# Patient Record
Sex: Female | Born: 1975 | Race: White | Hispanic: No | Marital: Married | Smoking: Never smoker
Health system: Southern US, Community
[De-identification: ages and names within clinical notes are randomized; demographics above are authoritative.]

## PROBLEM LIST (undated history)

## (undated) DIAGNOSIS — T8859XA Other complications of anesthesia, initial encounter: Secondary | ICD-10-CM

## (undated) DIAGNOSIS — Z8619 Personal history of other infectious and parasitic diseases: Secondary | ICD-10-CM

## (undated) DIAGNOSIS — IMO0002 Reserved for concepts with insufficient information to code with codable children: Secondary | ICD-10-CM

## (undated) DIAGNOSIS — T4145XA Adverse effect of unspecified anesthetic, initial encounter: Secondary | ICD-10-CM

## (undated) DIAGNOSIS — O139 Gestational [pregnancy-induced] hypertension without significant proteinuria, unspecified trimester: Secondary | ICD-10-CM

## (undated) DIAGNOSIS — N870 Mild cervical dysplasia: Secondary | ICD-10-CM

## (undated) DIAGNOSIS — R87629 Unspecified abnormal cytological findings in specimens from vagina: Secondary | ICD-10-CM

## (undated) HISTORY — DX: Gestational (pregnancy-induced) hypertension without significant proteinuria, unspecified trimester: O13.9

## (undated) HISTORY — DX: Unspecified abnormal cytological findings in specimens from vagina: R87.629

## (undated) HISTORY — DX: Reserved for concepts with insufficient information to code with codable children: IMO0002

## (undated) HISTORY — PX: TONSILLECTOMY AND ADENOIDECTOMY: SUR1326

## (undated) HISTORY — DX: Mild cervical dysplasia: N87.0

## (undated) HISTORY — DX: Adverse effect of unspecified anesthetic, initial encounter: T41.45XA

## (undated) HISTORY — DX: Personal history of other infectious and parasitic diseases: Z86.19

## (undated) HISTORY — DX: Other complications of anesthesia, initial encounter: T88.59XA

---

## 1996-04-18 HISTORY — PX: CARPAL TUNNEL RELEASE: SHX101

## 1997-07-21 ENCOUNTER — Ambulatory Visit (HOSPITAL_BASED_OUTPATIENT_CLINIC_OR_DEPARTMENT_OTHER): Admission: RE | Admit: 1997-07-21 | Discharge: 1997-07-21 | Payer: Self-pay | Admitting: Orthopedic Surgery

## 1999-07-06 ENCOUNTER — Other Ambulatory Visit: Admission: RE | Admit: 1999-07-06 | Discharge: 1999-07-06 | Payer: Self-pay | Admitting: Internal Medicine

## 2002-09-25 ENCOUNTER — Other Ambulatory Visit: Admission: RE | Admit: 2002-09-25 | Discharge: 2002-09-25 | Payer: Self-pay | Admitting: Obstetrics & Gynecology

## 2003-03-14 ENCOUNTER — Inpatient Hospital Stay (HOSPITAL_COMMUNITY): Admission: AD | Admit: 2003-03-14 | Discharge: 2003-03-14 | Payer: Self-pay | Admitting: Obstetrics & Gynecology

## 2003-03-18 ENCOUNTER — Inpatient Hospital Stay (HOSPITAL_COMMUNITY): Admission: AD | Admit: 2003-03-18 | Discharge: 2003-03-18 | Payer: Self-pay | Admitting: Obstetrics & Gynecology

## 2003-03-21 ENCOUNTER — Inpatient Hospital Stay (HOSPITAL_COMMUNITY): Admission: RE | Admit: 2003-03-21 | Discharge: 2003-03-24 | Payer: Self-pay | Admitting: Obstetrics & Gynecology

## 2003-03-21 ENCOUNTER — Encounter (INDEPENDENT_AMBULATORY_CARE_PROVIDER_SITE_OTHER): Payer: Self-pay | Admitting: *Deleted

## 2003-03-25 ENCOUNTER — Encounter: Admission: RE | Admit: 2003-03-25 | Discharge: 2003-04-24 | Payer: Self-pay | Admitting: Obstetrics & Gynecology

## 2003-04-25 ENCOUNTER — Encounter: Admission: RE | Admit: 2003-04-25 | Discharge: 2003-05-25 | Payer: Self-pay | Admitting: Obstetrics & Gynecology

## 2003-04-28 ENCOUNTER — Other Ambulatory Visit: Admission: RE | Admit: 2003-04-28 | Discharge: 2003-04-28 | Payer: Self-pay | Admitting: Obstetrics & Gynecology

## 2003-05-26 ENCOUNTER — Encounter: Admission: RE | Admit: 2003-05-26 | Discharge: 2003-06-25 | Payer: Self-pay | Admitting: Obstetrics & Gynecology

## 2003-07-24 ENCOUNTER — Encounter: Admission: RE | Admit: 2003-07-24 | Discharge: 2003-08-23 | Payer: Self-pay | Admitting: Obstetrics & Gynecology

## 2003-09-23 ENCOUNTER — Encounter: Admission: RE | Admit: 2003-09-23 | Discharge: 2003-10-23 | Payer: Self-pay | Admitting: Obstetrics & Gynecology

## 2003-11-23 ENCOUNTER — Encounter: Admission: RE | Admit: 2003-11-23 | Discharge: 2003-12-23 | Payer: Self-pay | Admitting: Obstetrics & Gynecology

## 2003-12-24 ENCOUNTER — Encounter: Admission: RE | Admit: 2003-12-24 | Discharge: 2004-01-23 | Payer: Self-pay | Admitting: Obstetrics & Gynecology

## 2004-02-23 ENCOUNTER — Encounter: Admission: RE | Admit: 2004-02-23 | Discharge: 2004-03-24 | Payer: Self-pay | Admitting: Obstetrics & Gynecology

## 2004-03-10 IMAGING — US US FETAL BPP W/O NONSTRESS
1 series · 18 of 23 positions shown · non-contrast
Comparison: none

CLINICAL DATA: 27-year-old.  G1 P0 with increased blood pressure.

[Series 1: us fetal bpp w/o nonstress · non-contrast · 18 of 23 slices shown]
[im 1/23]
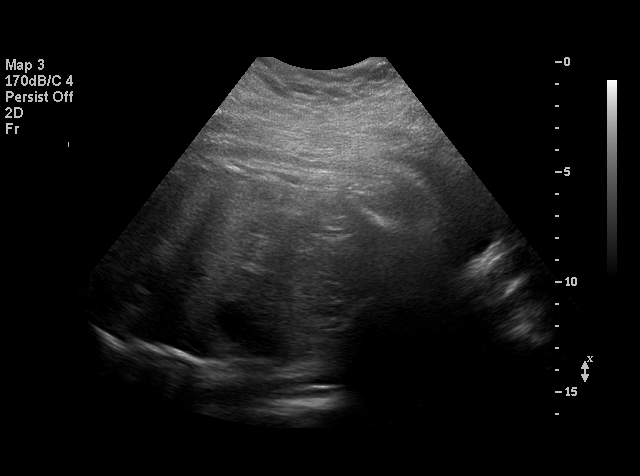
[im 2/23]
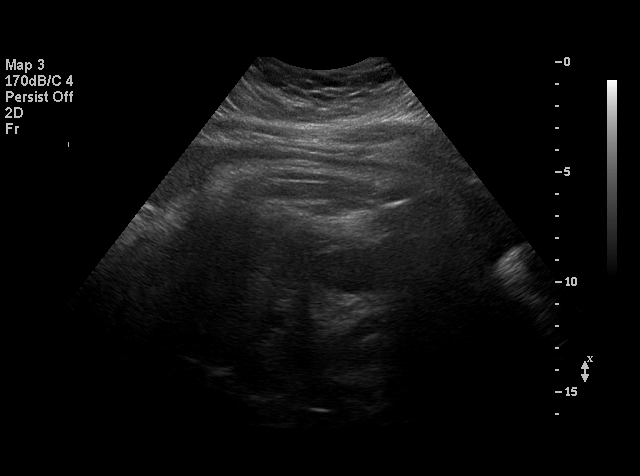
[im 4/23]
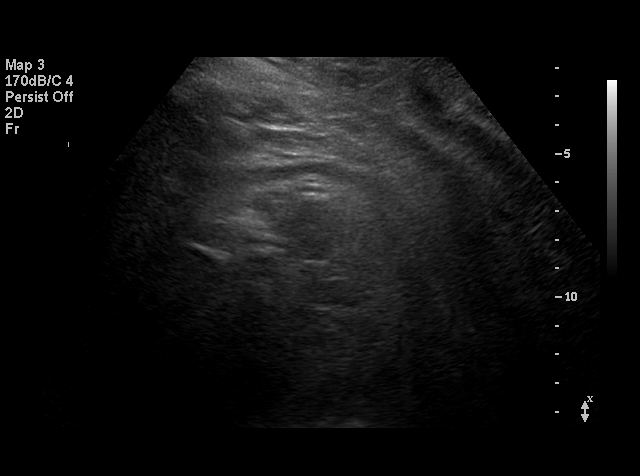
[im 5/23]
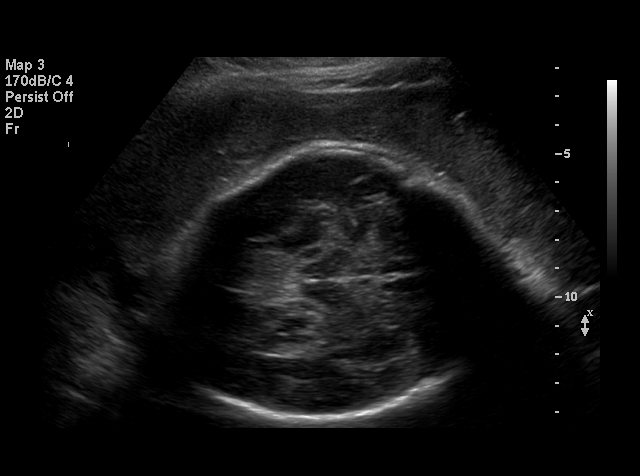
[im 6/23]
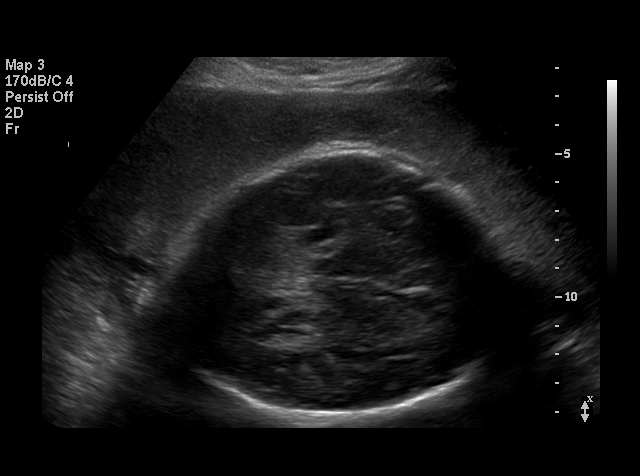
[im 8/23]
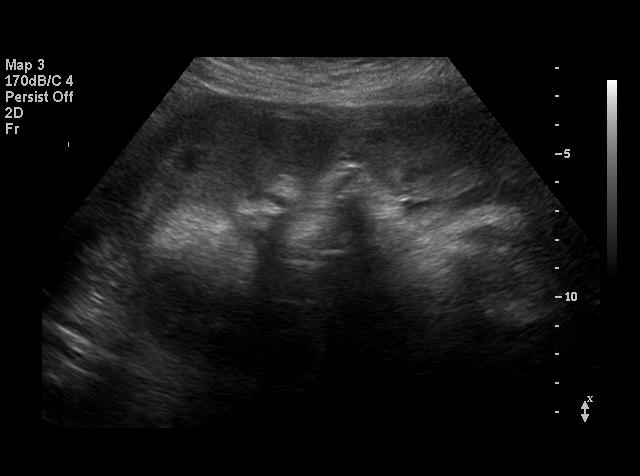
[im 9/23]
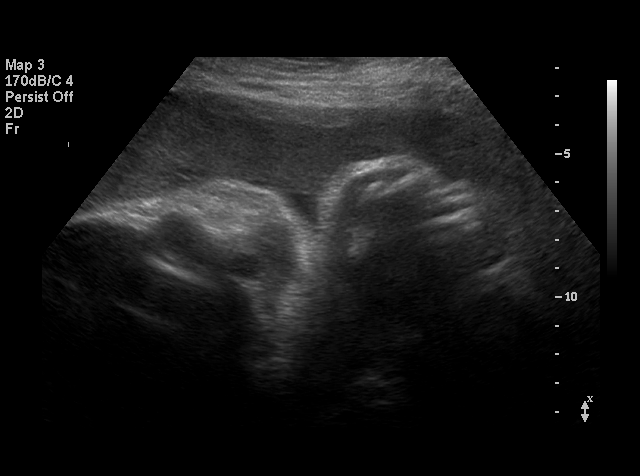
[im 10/23]
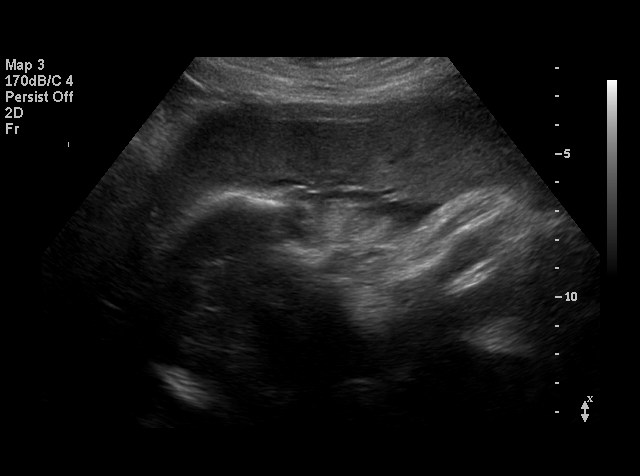
[im 11/23]
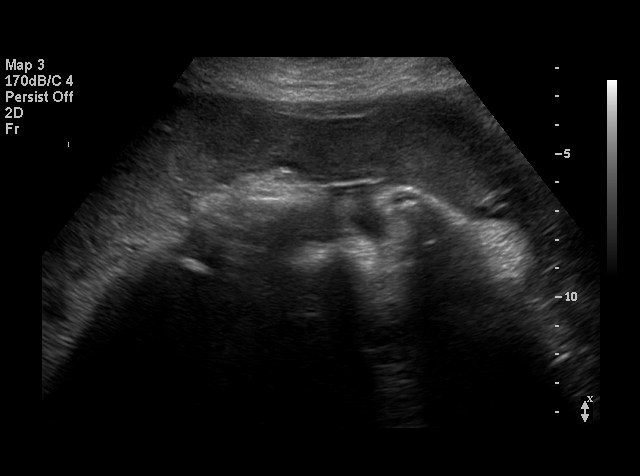
[im 13/23]
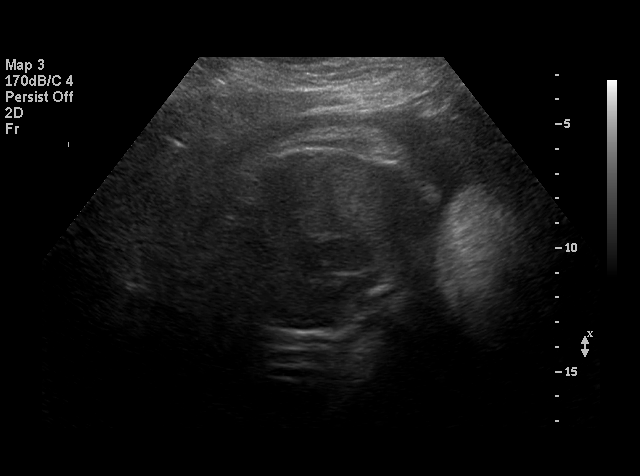
[im 14/23]
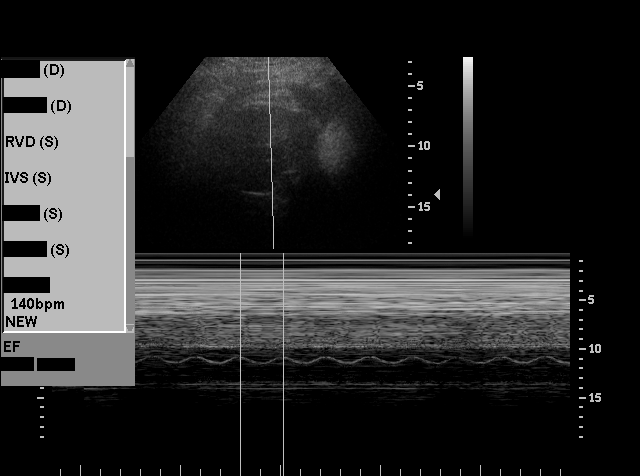
[im 15/23]
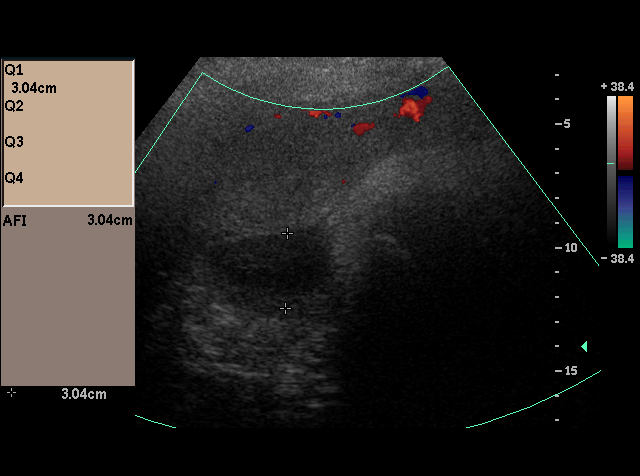
[im 16/23]
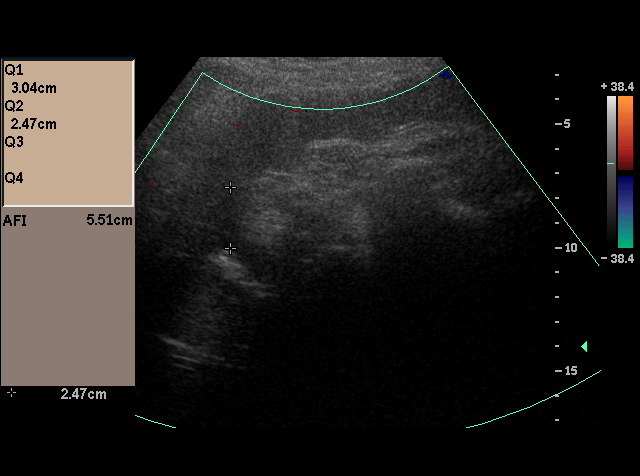
[im 18/23]
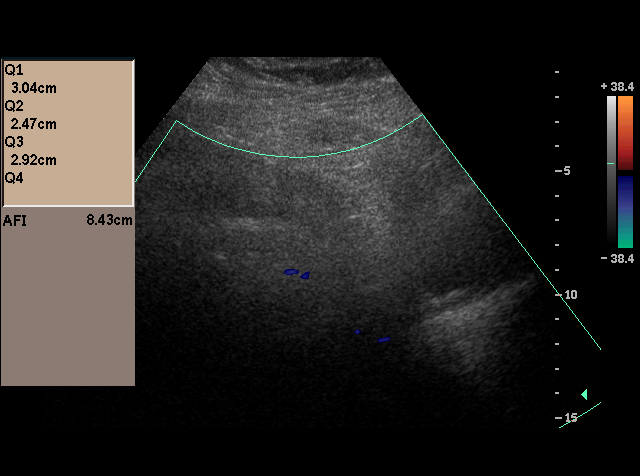
[im 19/23]
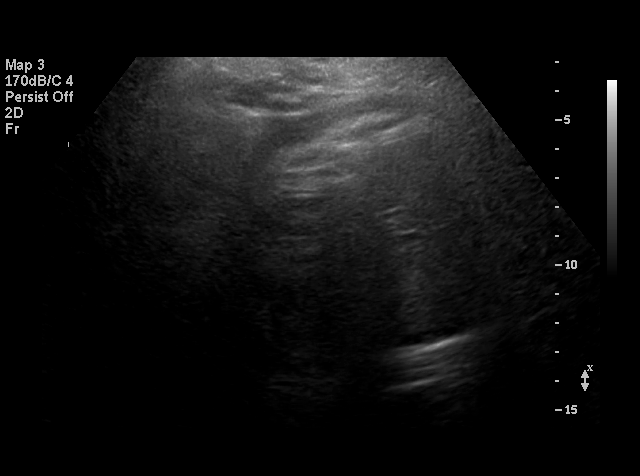
[im 20/23]
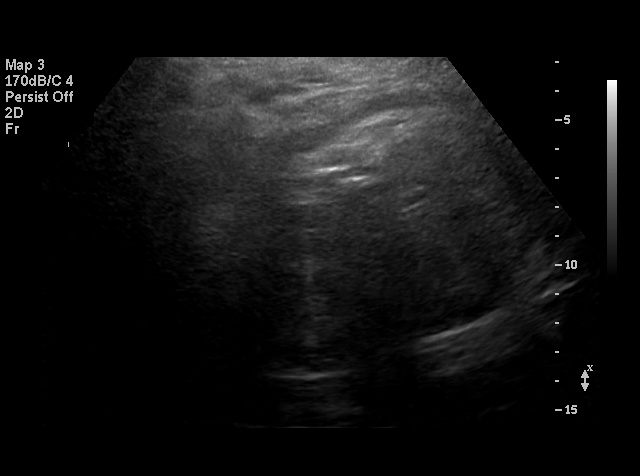
[im 22/23]
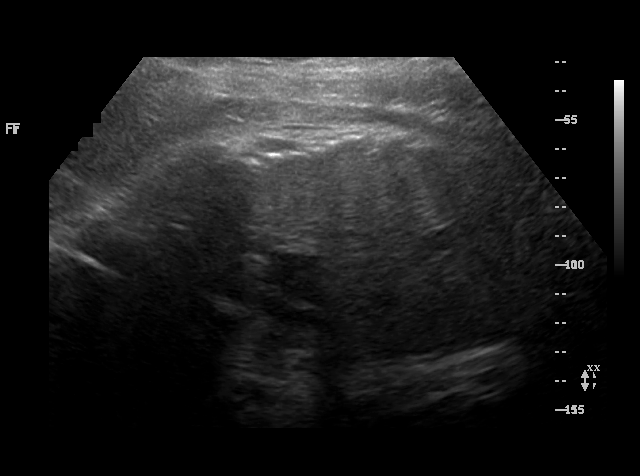
[im 23/23]
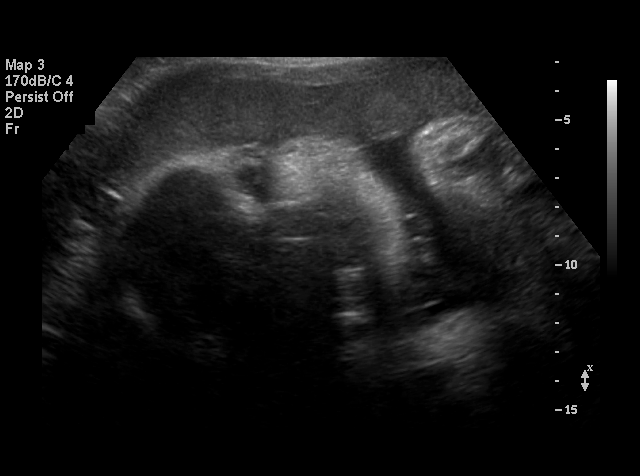

[18 of 23 positions shown; findings below may reference images not displayed]

BIOPHYSICAL PROFILE
 NUMBER OF FETUSES:  1
 HEART RATE:  140
 MOVEMENT:  Yes
 BREATHING:  Yes
 PRESENTATION:  Breech
 PLACENTAL LOCATION:  Anterior
 GRADE:  I
 PREVIA: No
 AMNIOTIC FLUID (SUBJECTIVE):  Low normal
 AMNIOTIC FLUID (OBJECTIVE):  8.43 cm AFI 

 Fetal measurements and complete anatomic evaluation were not requested on this exam.  The following fetal anatomy was visualized today:  Lateral ventricles, thalami, stomach, kidneys, bladder, and diaphragm

 BPP SCORING
 MOVEMENTS:  2  Time:  20 minutes
 BREATHING:  2
 TONE:  2
 AMNIOTIC FLUID:  2
 TOTAL SCORE:  8

 MATERNAL FINDINGS:
 CERVIX:  Not evaluated.
 IMPRESSION
 Single living intrauterine fetus in breech presentation.  Amniotic fluid volume is in the lower ranges of normal.  Biophysical profile is 8 of 8.

## 2004-04-24 ENCOUNTER — Encounter: Admission: RE | Admit: 2004-04-24 | Discharge: 2004-05-24 | Payer: Self-pay | Admitting: Obstetrics & Gynecology

## 2004-06-04 ENCOUNTER — Other Ambulatory Visit: Admission: RE | Admit: 2004-06-04 | Discharge: 2004-06-04 | Payer: Self-pay | Admitting: Obstetrics and Gynecology

## 2004-08-06 ENCOUNTER — Ambulatory Visit (HOSPITAL_COMMUNITY): Admission: RE | Admit: 2004-08-06 | Discharge: 2004-08-06 | Payer: Self-pay | Admitting: Obstetrics and Gynecology

## 2004-12-20 ENCOUNTER — Inpatient Hospital Stay (HOSPITAL_COMMUNITY): Admission: AD | Admit: 2004-12-20 | Discharge: 2004-12-22 | Payer: Self-pay | Admitting: Obstetrics and Gynecology

## 2004-12-23 ENCOUNTER — Encounter: Admission: RE | Admit: 2004-12-23 | Discharge: 2005-01-21 | Payer: Self-pay | Admitting: Obstetrics and Gynecology

## 2005-01-22 ENCOUNTER — Encounter: Admission: RE | Admit: 2005-01-22 | Discharge: 2005-02-21 | Payer: Self-pay | Admitting: Obstetrics and Gynecology

## 2005-02-22 ENCOUNTER — Encounter: Admission: RE | Admit: 2005-02-22 | Discharge: 2005-03-23 | Payer: Self-pay | Admitting: Obstetrics and Gynecology

## 2005-03-24 ENCOUNTER — Encounter: Admission: RE | Admit: 2005-03-24 | Discharge: 2005-04-23 | Payer: Self-pay | Admitting: Obstetrics and Gynecology

## 2005-04-18 DIAGNOSIS — IMO0002 Reserved for concepts with insufficient information to code with codable children: Secondary | ICD-10-CM

## 2005-04-18 HISTORY — DX: Reserved for concepts with insufficient information to code with codable children: IMO0002

## 2005-04-24 ENCOUNTER — Encounter: Admission: RE | Admit: 2005-04-24 | Discharge: 2005-05-24 | Payer: Self-pay | Admitting: Obstetrics and Gynecology

## 2005-05-25 ENCOUNTER — Encounter: Admission: RE | Admit: 2005-05-25 | Discharge: 2005-06-21 | Payer: Self-pay | Admitting: Obstetrics and Gynecology

## 2005-06-22 ENCOUNTER — Encounter: Admission: RE | Admit: 2005-06-22 | Discharge: 2005-07-22 | Payer: Self-pay | Admitting: Obstetrics and Gynecology

## 2005-07-23 ENCOUNTER — Encounter: Admission: RE | Admit: 2005-07-23 | Discharge: 2005-08-21 | Payer: Self-pay | Admitting: Obstetrics and Gynecology

## 2005-08-22 ENCOUNTER — Encounter: Admission: RE | Admit: 2005-08-22 | Discharge: 2005-09-21 | Payer: Self-pay | Admitting: Obstetrics and Gynecology

## 2005-08-31 ENCOUNTER — Other Ambulatory Visit: Admission: RE | Admit: 2005-08-31 | Discharge: 2005-08-31 | Payer: Self-pay | Admitting: Obstetrics and Gynecology

## 2005-09-22 ENCOUNTER — Encounter: Admission: RE | Admit: 2005-09-22 | Discharge: 2005-10-22 | Payer: Self-pay | Admitting: Obstetrics and Gynecology

## 2005-10-21 DIAGNOSIS — N870 Mild cervical dysplasia: Secondary | ICD-10-CM

## 2005-10-21 HISTORY — DX: Mild cervical dysplasia: N87.0

## 2006-03-03 ENCOUNTER — Other Ambulatory Visit: Admission: RE | Admit: 2006-03-03 | Discharge: 2006-03-03 | Payer: Self-pay | Admitting: Obstetrics and Gynecology

## 2006-12-20 ENCOUNTER — Inpatient Hospital Stay (HOSPITAL_COMMUNITY): Admission: RE | Admit: 2006-12-20 | Discharge: 2006-12-22 | Payer: Self-pay | Admitting: Obstetrics and Gynecology

## 2009-04-08 ENCOUNTER — Encounter: Admission: RE | Admit: 2009-04-08 | Discharge: 2009-04-15 | Payer: Self-pay | Admitting: Obstetrics and Gynecology

## 2009-10-06 ENCOUNTER — Inpatient Hospital Stay (HOSPITAL_COMMUNITY): Admission: AD | Admit: 2009-10-06 | Discharge: 2009-10-06 | Payer: Self-pay | Admitting: Obstetrics and Gynecology

## 2009-10-18 ENCOUNTER — Inpatient Hospital Stay (HOSPITAL_COMMUNITY): Admission: RE | Admit: 2009-10-18 | Discharge: 2009-10-20 | Payer: Self-pay | Admitting: Obstetrics and Gynecology

## 2010-05-09 ENCOUNTER — Encounter: Payer: Self-pay | Admitting: Obstetrics and Gynecology

## 2010-07-04 LAB — CBC
HCT: 32.9 % — ABNORMAL LOW (ref 36.0–46.0)
HCT: 35.8 % — ABNORMAL LOW (ref 36.0–46.0)
HCT: 37.5 % (ref 36.0–46.0)
Hemoglobin: 11.3 g/dL — ABNORMAL LOW (ref 12.0–15.0)
Hemoglobin: 12.3 g/dL (ref 12.0–15.0)
Hemoglobin: 13 g/dL (ref 12.0–15.0)
MCH: 29.4 pg (ref 26.0–34.0)
MCH: 29.5 pg (ref 26.0–34.0)
MCHC: 34.2 g/dL (ref 30.0–36.0)
MCHC: 34.3 g/dL (ref 30.0–36.0)
MCHC: 34.8 g/dL (ref 30.0–36.0)
MCV: 85.9 fL (ref 78.0–100.0)
MCV: 86.4 fL (ref 78.0–100.0)
MCV: 84.4 fL (ref 78.0–100.0)
Platelets: 144 10*3/uL — ABNORMAL LOW (ref 150–400)
Platelets: 172 10*3/uL (ref 150–400)
Platelets: 168 10*3/uL (ref 150–400)
RBC: 3.81 MIL/uL — ABNORMAL LOW (ref 3.87–5.11)
RBC: 4.17 MIL/uL (ref 3.87–5.11)
RBC: 4.44 MIL/uL (ref 3.87–5.11)
RDW: 15.7 % — ABNORMAL HIGH (ref 11.5–15.5)
RDW: 15.8 % — ABNORMAL HIGH (ref 11.5–15.5)
RDW: 15.2 % (ref 11.5–15.5)
WBC: 10.6 10*3/uL — ABNORMAL HIGH (ref 4.0–10.5)
WBC: 10.8 10*3/uL — ABNORMAL HIGH (ref 4.0–10.5)
WBC: 10.8 10*3/uL — ABNORMAL HIGH (ref 4.0–10.5)

## 2010-07-04 LAB — GLUCOSE, CAPILLARY
Glucose-Capillary: 101 mg/dL — ABNORMAL HIGH (ref 70–99)
Glucose-Capillary: 116 mg/dL — ABNORMAL HIGH (ref 70–99)
Glucose-Capillary: 121 mg/dL — ABNORMAL HIGH (ref 70–99)
Glucose-Capillary: 122 mg/dL — ABNORMAL HIGH (ref 70–99)
Glucose-Capillary: 126 mg/dL — ABNORMAL HIGH (ref 70–99)
Glucose-Capillary: 131 mg/dL — ABNORMAL HIGH (ref 70–99)
Glucose-Capillary: 168 mg/dL — ABNORMAL HIGH (ref 70–99)
Glucose-Capillary: 68 mg/dL — ABNORMAL LOW (ref 70–99)
Glucose-Capillary: 74 mg/dL (ref 70–99)
Glucose-Capillary: 81 mg/dL (ref 70–99)
Glucose-Capillary: 86 mg/dL (ref 70–99)
Glucose-Capillary: 90 mg/dL (ref 70–99)

## 2010-07-04 LAB — LACTATE DEHYDROGENASE: LDH: 124 U/L (ref 94–250)

## 2010-07-04 LAB — COMPREHENSIVE METABOLIC PANEL
ALT: 23 U/L (ref 0–35)
AST: 20 U/L (ref 0–37)
Albumin: 2.9 g/dL — ABNORMAL LOW (ref 3.5–5.2)
Alkaline Phosphatase: 97 U/L (ref 39–117)
BUN: 10 mg/dL (ref 6–23)
CO2: 21 mEq/L (ref 19–32)
Calcium: 8.9 mg/dL (ref 8.4–10.5)
Chloride: 107 mEq/L (ref 96–112)
Creatinine, Ser: 0.52 mg/dL (ref 0.4–1.2)
GFR calc Af Amer: >60 mL/min (ref >60)
GFR calc non Af Amer: >60 mL/min (ref >60)
Glucose, Bld: 97 mg/dL (ref 70–99)
Potassium: 3.9 mEq/L (ref 3.5–5.1)
Sodium: 134 mEq/L — ABNORMAL LOW (ref 135–145)
Total Bilirubin: 0.3 mg/dL (ref 0.3–1.2)
Total Protein: 6.7 g/dL (ref 6.0–8.3)

## 2010-07-04 LAB — URIC ACID: Uric Acid, Serum: 5.3 mg/dL (ref 2.4–7.0)

## 2010-07-04 LAB — RPR: RPR Ser Ql: NONREACTIVE

## 2010-08-31 NOTE — H&P (Signed)
NAME:  [name], [name] ACCOUNT NO.:  [id number]   MEDICAL RECORD NO.:  [id number]          PATIENT TYPE:  INP   LOCATION:  9164                          [facility]   PHYSICIAN:  Naima A. Dillard, M.D. DATE OF BIRTH:  Sep 22, 1975   DATE OF ADMISSION:  12/20/2006  DATE OF DISCHARGE:                              HISTORY & PHYSICAL   HISTORY OF PRESENT ILLNESS:  [name] is a 35 year old  gravida 3, para 2-0-0-2 at 40-1/7 weeks who presented for induction  secondary to [facility], previous cesarean section with a subsequent V-back and  desire for V-back this delivery.   HOSPITAL COURSE:  1. Previous cesarean section with a subsequent V-back with a plan for      V-back for this labor.  2. PIH this pregnancy with a history of PIH in her first pregnancy.  3. Elevated BMI.  4. Group B strep negative.   LABORATORY DATA:  Blood type is A positive.  RH antibody negative.  VDR  nonreactive.  Rubella titer positive.  Hepatitis B surface antigen  negative.  HIV is nonreactive.  GC and chlamydia cultures were negative.  Hemoglobin was 12.5.  It was within normal limits at 27 weeks.  She  desired her anatomy screen with Dr. Sherrie George.  Her Glucola was 133.  Her  group B strep culture was negative.   HISTORY OF PRESENT PREGNANCY:  The patient had care at approximately 10  weeks.  General ultrasound at that visit for dating.  He had an EDC of  12/19/2006.  She desired her anatomy screen with Dr. Sherrie George.  This was  done at 18 weeks with normal findings.  She had some vomiting and  diarrhea at 18 weeks.  This resolved.  She desired by 22 weeks to plan a  V-back.  Consent was signed at that time.  She was measuring size  greater than dates at 36 weeks.  She had an ultrasound 37 weeks showing  growth at the 94 percentile.  Estimated weight 8 pounds 6 ounces.  Normal fluid.  Anterior placenta.  At 35 weeks, the patient had a blood  pressure 140/84, at 36 weeks 138/84, and at 37  weeks 140/90.  She had a  pH workup that was negative.  A 24-hour urine was normal.  She was  continued to be observed closely.  Blood pressure remained slightly  elevated but did not require any medications.   PREGNANCY HISTORY:  She had a 2004, she had a primary low transverse  cesarean section for a female infant, weight 6 pounds 7 ounces at 38  weeks.  She was delivered by C-section because of oligohydramnios in  breech presentation.  In 2006, she had a vaginal birth after cesarean of  a female infant weighing 7 pounds 6 ounces at 38-3/7 weeks.  She was in  labor for 11 hours.  She had epidural anesthesia.  She had no  complications.   PAST MEDICAL HISTORY:  She is a previous [employer] user.  She did have  PIH reported with her first pregnancy.  She reports usual childhood  illnesses.   PAST SURGICAL HISTORY:  In 1998, she had surgery for carpal  tunnel on  both hands.  At age 89, she had tonsils and adenoids.  C-section in  2004.  Today history is unremarkable.   ALLERGIES:  None.   FAMILY HISTORY:  Her father had mitral valve prolapse.  Her paternal  grandfather had an MI.  Paternal grandmother had hypertension.  Paternal  grandmother had varicosities.  Her mother has anemia.  Her father and  paternal grandfather have diabetes.  Her mother had breast cancer.  Her  maternal grandfather had skin cancer.   SOCIAL HISTORY:  The patient is married to the father of the baby.  He  is involved and supportive.  His name is Passenger transport manager.  The  patient has one year of college.  She is a Futures trader.  Her husband has  one year of college.  He is employed with [employer].  She is Caucasian  of a [place].  She has been followed by the physician service at  [facility].  She denies any alcohol, drug, or tobacco use  during this pregnancy.   PHYSICAL EXAMINATION:  VITAL SIGNS:  Blood pressure 146/89.  Other vital  signs are stable.  HEENT:  Within normal limits.  LUNGS:   Breath sounds are clear.  HEART:  Regular rate and rhythm without murmur.  BREAST:  Soft and nontender.  ABDOMEN:  Fundal height is approximately 41 to 42 cm.  Estimated fetal  weight is 8 to 8.5 pounds.  Uterine contractions every 6 minutes, mild  quality.  Cervix has been 3 cm in the office, vertex presentation.  EXTREMITIES:  Deep tendon reflexes are 2+ without clonus.  There is a  trace edema noted.   IMPRESSION:  1. Intrauterine pregnancy at 40-1/7 weeks.  2. Mild pregnancy induced hypertension.  3. Favorable cervix.  4. Previous cesarean section with subsequent V-back with patient      desire for V-back.  Consent was signed during the pregnancy.   PLAN:  1. Admit to the birthing suite.  Will consult with Dr. Normand Sloop as the      attending physician.  2. Routine physician orders.  3. Dr. Normand Sloop plans artificial rupture of membranes and augmentation      p.r.n.  4. Risks and benefits of V-back have been reviewed with the patient in      the office and will again be reviewed with the patient by Dr.      Normand Sloop.  The patient does wish to proceed with V-back.      [name], C.N.M.      Naima A. Normand Sloop, M.D.  Electronically Signed    VLL/MEDQ  D:  12/20/2006  T:  12/20/2006  Job:  [id number]

## 2010-09-03 NOTE — H&P (Signed)
NAME:  PRANIKA, FINKS                    ACCOUNT NO.:  [id number]   MEDICAL RECORD NO.:  [id number]                   PATIENT TYPE:  INP   LOCATION:  NA                                   [facility]   PHYSICIAN:  Genia Del, M.D.             DATE OF BIRTH:  07/29/1975   DATE OF ADMISSION:  DATE OF DISCHARGE:                                HISTORY & PHYSICAL   PATIENT IDENTIFICATION:  [name] is a 35 year old G1; expected date  of delivery by ultrasound April 06, 2003; at 37 weeks five days  gestation.   REASON FOR ADMISSION:  Severe oligohydramnios and frank breech presentation,  for C-section.   HISTORY OF PRESENT ILLNESS:  The patient was followed closely recently for  mild PIH.  PIH labs were within normal limits last time on March 18, 2003.  She had frequent fetal well-being assessment.  The last one on  March 20, 2003 showed a biophysical profile of 6/8.  The points were lost  on the fluid which was at 2.5 cm amniotic fluid index for the first  percentile, estimated fetal weight at 7 and 4, 64th percentile, and frank  breech presentation with placenta grade 3.  Based on those findings the  decision was made to proceed with C-section as early as possible.  Fetal  movements positive, no fluid leak, no vaginal bleeding, no regular uterine  contractions, and no PIH symptoms currently.   PAST MEDICAL HISTORY:  Negative.   PAST SURGICAL HISTORY:  Positive for bilateral carpal tunnel surgery in 1998  and 1999.  In 1994 she had a tonsillectomy.   FAMILY HISTORY:  Positive for cardiovascular disease, chronic hypertension,  dysrhythmia, and diabetes mellitus.   ALLERGIES:  No known drug allergies.   MEDICATIONS:  Prenate vitamins.   SOCIAL HISTORY:  Married, nonsmoker.   HISTORY OF PRESENT PREGNANCY:  First trimester was normal.  Labs showed a  hemoglobin at 13.7, platelets at 224, A positive , antibodies negative, RPR  nonreactive, HbsAg  nonreactive, HIV nonreactive, and rubella immune.  An  ultrasound was done at 12.3 weeks for which her due date was changed.  She  then had, because of positive family history of diabetes mellitus, a one-  hour GTT that was within normal limits at 16+ weeks.  The patient declined a  triple test and ultrasound review of anatomy was done at 20 weeks which was  limited by maternal body habitus, so a review of anatomy was done at [facility].  It was within normal limits except for a slightly upper  limit right lateral ventricle and that was within normal limits on follow-  up.  Placenta was anterior fundal, normal.  At 27+ weeks the one-hour GTT  was repeated and was within normal limits at 135.  Refer to history of  present illness for blood pressure issues and fetal well-being surveillance.  Group B strep was negative at 35+  weeks.   REVIEW OF SYSTEMS:  CONSTITUTIONAL:  Negative.  HEENT:  Negative.  CARDIOVASCULAR/RESPIRATORY:  Negative.  URO/GI:  Negative.  NEURO/DERMATO/ENDOCRINO:  Negative.   PHYSICAL EXAMINATION:  GENERAL:  No apparent distress.  VITAL SIGNS:  Blood pressure was at 142/80, pulse regular, normal  respiratory rate 20, temperature afebrile.  LUNGS:  Clear bilaterally.  HEART:  Regular cardiac rhythm, no murmur.  ABDOMEN:  Gravid with a uterine height at 38, breech presentation per  ultrasound.  PELVIC:  Vaginal exam deferred.  EXTREMITIES:  Lower limbs with mild edema.  FETAL DATA:  Fetal heart rate 144 per minute.   IMPRESSION:  Gravida 1, 37 weeks five days, with severe oligohydramnios and  frank breech presentation, borderline pregnancy-induced hypertension.   PLAN:  Admit to [facility] for primary elective cesarean section.  Surgery plus risks reviewed including trauma, infection, hemorrhage, DVT,  pulmonary embolism and anesthesiology.  The patient agreed and voiced  understanding.                                               Genia Del, M.D.    ML/MEDQ  D:  03/21/2003  T:  03/21/2003  Job:  [id number]

## 2010-09-03 NOTE — H&P (Signed)
[name], [name]          ACCOUNT NO.:  [id number]   MEDICAL RECORD NO.:  [id number]          PATIENT TYPE:  INP   LOCATION:  9164                          [facility]   PHYSICIAN:  Naima A. Dillard, M.D. DATE OF BIRTH:  Dec 29, 1975   DATE OF ADMISSION:  12/20/2004  DATE OF DISCHARGE:                              HISTORY & PHYSICAL   HISTORY OF PRESENT ILLNESS:  This is a 35 year old, gravida 2, para 1-0-  0-1, at 38-3/7 weeks who presents with complaints of contractions all  night.  She reports positive fetal movement and denies leaking or  bleeding.  Pregnancy has been followed by the physician service and  remarkable for:  1. Previous cesarean section for breech.  2. History of oligo.  3. Obesity.  4. Group B strep negative.  5. Desires trial of labor.   OB HISTORY:  Remarkable for cesarean section in 2004 of a female infant  at [redacted] weeks gestation, weighing 6 pounds, 7 ounces.  Remarkable for  oligohydramnios and breech presentation.   MEDICAL HISTORY:  Remarkable for obesity and carpal tunnel syndrome.   PAST SURGICAL HISTORY:  1. Remarkable for 1998, carpal tunnel syndrome x2.  2. At age 5, tonsils removed.  3. In 2004, cesarean section.   FAMILY HISTORY:  Remarkable for grandmother with stroke.  Father with  mitral valve prolapse.  Grandfather with heart attack.  Grandmother with  hypertension and varicose veins.  Mother with anemia.  Father and  grandfather with diabetes.  Mother with breast cancer.  Grandfather with  skin cancer.   GENETIC HISTORY:  Unremarkable.   SOCIAL HISTORY:  The patient is married to [employer] who is  involved and supportive.  She works as an Haematologist.  She  is of the [employer].  She denies any alcohol,  tobacco or drug use.   PRENATAL LABS:  Hemoglobin 13.4, platelets 222, blood type  A positive,  antibody screen negative.  RPR nonreactive.  Rubella immune.  Hepatitis  negative.   HISTORY OF CURRENT PREGNANCY:  The patient entered care at [redacted] weeks  gestation.  She was given a VBAC consent form to review.  She had  ultrasound for dates at 11 weeks.  She is treated for a UTI at 13 weeks.  She had ultrasound for anatomy at 18 weeks and another level 2  ultrasound at [facility] to complete her anatomy scan.  She had several  conversations with physicians regarding VBAC and eventually decided to  on a trial of labor.  She has an ultrasound at 33 weeks for poor weight  gain and the baby showed 77th percentile growth, vertex presentation and  group B strep and GC and Chlamydia were all negative at term.   PHYSICAL EXAMINATION:  VITAL SIGNS:  Stable and afebrile.  HEENT: Within normal limits.  NECK:  Thyroid normal, not enlarged.  CHEST:  Clear to auscultation.  HEART:  Regular rate and rhythm.  ABDOMEN:  Gravid, at 38 cm, vertex by [place].  EFM shows reactive fetal  heart rate with uterine contractions every three to four minutes with  clusters of contractions  that occur every minute.  Cervix was initially  3 cm, 90% effaced, -1 station, vertex presentation and after one hour  changed to 4 cm, 90%, -1.  She has small amount of bloody show.  EXTREMITIES:  Within normal limits.   ASSESSMENT:  1. Intrauterine pregnancy at 38-3/7 weeks.  2. Early active labor.  3. Trial of labor desired.   PLAN:  1. Consult with Dr. Normand Sloop.  Will admit to birthing suite.  2. Routine M.D. orders.  3. Stadol and then epidural for analgesia.      [name], C.N.M.      Naima A. Normand Sloop, M.D.  Electronically Signed    MLW/MEDQ  D:  12/20/2004  T:  12/20/2004  Job:  [id number]

## 2010-09-03 NOTE — Discharge Summary (Signed)
NAMEJAMI, BOGDANSKI                    ACCOUNT NO.:  [id number]   MEDICAL RECORD NO.:  [id number]                   PATIENT TYPE:  INP   LOCATION:  9144                                 [facility]   PHYSICIAN:  Genia Del, M.D.             DATE OF BIRTH:  July 09, 1975   DATE OF ADMISSION:  03/21/2003  DATE OF DISCHARGE:  03/24/2003                                 DISCHARGE SUMMARY   ADMISSION DIAGNOSES:  1. Thirty seven weeks and five days.  2. Oligohydramnios.  3. Homero Fellers breech presentation.   DISCHARGE DIAGNOSES:  1. Thirty seven weeks and five days.  2. Oligohydramnios.  3. Homero Fellers breech presentation.  4. Birth of a baby girl on March 21, 2003 by cesarean section.   INTERVENTION:  Primary elective low transverse C-section on March 21, 2003.   HOSPITAL COURSE:  Mrs. Baumgardner had a primary elective low transverse C-  section done because of oligohydramnios and a frank breech presentation.  The C-section went without complication, estimated blood loss 700 mL, a baby  girl was born with Apgars 9 and 9, pH 7.31.  The postop course was  uneventful.  She remained hemodynamically stable and afebrile.  Postop  hemoglobin was 11.6.  She was discharged to home on postop day #3 on  March 24, 2003.  Postop advices were given.  She was prescribed Tylox and  Motrin p.r.n.  She will follow up at [facility] in [facility] weeks.                                               Genia Del, M.D.    ML/MEDQ  D:  04/12/2003  T:  04/12/2003  Job:  [id number]

## 2010-09-03 NOTE — Discharge Summary (Signed)
[name], [name]          ACCOUNT NO.:  [id number]   MEDICAL RECORD NO.:  [id number]          PATIENT TYPE:  INP   LOCATION:  9131                          [facility]   PHYSICIAN:  Crist Fat. Rivard, M.D. DATE OF BIRTH:  Apr 29, 1975   DATE OF ADMISSION:  12/20/2004  DATE OF DISCHARGE:  12/22/2004                                 DISCHARGE SUMMARY   ADMITTING DIAGNOSES:  1.  Intrauterine pregnancy at 38-3/7 weeks.  2.  Early active labor.  3.  Desires trial of labor for the vaginal birth after cesarean delivery.   DISCHARGE DIAGNOSIS:  1.  Intrauterine pregnancy at 38-3/7 weeks.  2.  Early active labor.  3.  Desires trial of labor for the vaginal birth after cesarean delivery.   Ms. [name] is a 35 year old gravida 2, para 1-0-0-1 who presented at 9-  3/7 weeks in early labor.  She was admitted.  Labor was stimulated with  artificial rupture of membranes and progressed normally. The patient pushed  well and delivered a viable female infant with Apgar scores of 8 at one  minute 9 at five minutes by VBAC with Dr. Jaymes Graff. The patient has  done quite well in the immediate post partum period.  Her fundus has  remained firm.  Bleeding was minimal. Her hemoglobin on the first postpartum  day was 12.1. Her vital signs have remained stable and on this. her second  postpartum day she is judged to be in satisfactory condition for discharge.  Discharge instructions per [facility] handout.   DISCHARGE MEDICATIONS:  1.  Motrin 600 milligrams p.o. q.6h. p.r.n. pain.  2.  Tylox one to two p.o. q.3-4h. pain.  3.  Micronor for contraception.  4.  Prenatal vitamins.   The patient will follow up at the office of [facility] in 6 weeks.      Rica Koyanagi, C.N.M.      Crist Fat Rivard, M.D.  Electronically Signed    SDM/MEDQ  D:  12/22/2004  T:  12/22/2004  Job:  [id number]

## 2010-09-03 NOTE — Op Note (Signed)
[name], [name]                    ACCOUNT NO.:  [id number]   MEDICAL RECORD NO.:  [id number]                   PATIENT TYPE:  INP   LOCATION:  9144                                 [facility]   PHYSICIAN:  Genia Del, M.D.             DATE OF BIRTH:  Jan 20, 1976   DATE OF PROCEDURE:  03/21/2003  DATE OF DISCHARGE:                                 OPERATIVE REPORT   PREOPERATIVE DIAGNOSES:  1. Thirty-seven weeks and five days' gestation.  2. Severe oligohydramnios.  3. Homero Fellers breech presentation.   POSTOPERATIVE DIAGNOSES:  1. Thirty-seven weeks and five days' gestation.  2. Severe oligohydramnios.  3. Homero Fellers breech presentation.   INTERVENTION:  Primary elective low transverse cesarean section.   SURGEON:  Genia Del, M.D.   ASSISTANT:  Derek Jack.   ANESTHESIA:  Burnett Corrente, M.D.   DESCRIPTION OF PROCEDURE:  Under spinal anesthesia the patient was in 15  degree lateral decubitus position.  She is prepped with Hibiclens on the  abdominal, suprapubic, vulvar, and vaginal areas.  A bladder catheter is  inserted and the patient is draped as usual.  A Pfannenstiel incision is  made with a scalpel.  We open the adipose tissue and the aponeurosis  transversely with a Mayo scissors.  We detach the recti muscles from the  aponeurosis of the midline superiorly and inferiorly.  The parietal  peritoneum is opened with a Metzenbaum scissors.  The bladder retractor is  inserted.  The parietal peritoneum is opened transversely with a Metzenbaum  scissors over the lower uterine segment.  The bladder is reclined downward.  The bladder retractor is repositioned.  We use the scalpel to make a low  transverse hysterotomy.  The incision is ___________ on each side with  scissors.  The amniotic fluid is clear.  The baby is in frank breech  position.  Birth of a baby girl at 41.  The baby is suctioned.  The cord  is clamped and cut.  The baby is given to the  neonatal team.  Apgars are 9  and 9.  The pH is 7.31.  The placenta is evacuated spontaneously, three  vessels, complete.  Uterine revision done.  Note that the cord blood was  taken.  The patient was given Ancef 1 g IV and Pitocin was started in the IV  fluids.  The hysterotomy is closed in a first locked running suture of 0  Vicryl, a second plain in a mattress fashion is done with 0 Vicryl,  hemostasis is adequate.  The two ovaries are normal in appearance and  volume.  The two tubes are normal in appearance and volume.  The uterus is  well-contracted.  We then irrigate and suction the abdominopelvic cavity.  Blood clots are removed.  We control hemostasis on the bladder flap on the  recti muscles and on the adipose tissue with the electrocautery.  We close  the aponeurosis in two half-running sutures  of 0 Vicryl.  We then  reapproximate the skin with staples and a dry dressing  is applied.  The count of sponges and instruments was complete x2.  The  estimated blood loss was 700 mL.  No complication occurred, and the patient  was transferred to recovery room in good status.  Rubella immune, Rh  positive.                                               Genia Del, M.D.    ML/MEDQ  D:  03/21/2003  T:  03/22/2003  Job:  [id number]

## 2011-01-28 LAB — DIFFERENTIAL
Eosinophils Absolute: 0.1
Lymphocytes Relative: 29
Lymphs Abs: 4 — ABNORMAL HIGH
Monocytes Relative: 6
Neutrophils Relative %: 65

## 2011-01-28 LAB — CBC
HCT: 33.6 — ABNORMAL LOW
HCT: 36.8
Hemoglobin: 11.7 — ABNORMAL LOW
Hemoglobin: 12.8
MCHC: 34.7
MCHC: 34.8
MCV: 84.1
MCV: 84.9
Platelets: 150
Platelets: 178
RBC: 3.96
RBC: 4.37
RDW: 15.6 — ABNORMAL HIGH
RDW: 15.7 — ABNORMAL HIGH
WBC: 12.1 — ABNORMAL HIGH
WBC: 13.7 — ABNORMAL HIGH

## 2011-01-28 LAB — COMPREHENSIVE METABOLIC PANEL
ALT: 19
AST: 31
Calcium: 8.6
GFR calc Af Amer: >60
Glucose, Bld: 134 — ABNORMAL HIGH
Sodium: 137
Total Protein: 5.6 — ABNORMAL LOW

## 2011-01-28 LAB — RPR: RPR Ser Ql: NONREACTIVE

## 2011-01-28 LAB — CCBB MATERNAL DONOR DRAW

## 2011-01-28 LAB — URIC ACID: Uric Acid, Serum: 7.4 — ABNORMAL HIGH

## 2011-08-19 ENCOUNTER — Ambulatory Visit (INDEPENDENT_AMBULATORY_CARE_PROVIDER_SITE_OTHER): Payer: BC Managed Care – PPO | Admitting: Obstetrics and Gynecology

## 2011-08-19 ENCOUNTER — Encounter: Payer: Self-pay | Admitting: Obstetrics and Gynecology

## 2011-08-19 VITALS — BP 118/84 | HR 84 | Ht 66.5 in | Wt 271.0 lb

## 2011-08-19 DIAGNOSIS — IMO0002 Reserved for concepts with insufficient information to code with codable children: Secondary | ICD-10-CM | POA: Insufficient documentation

## 2011-08-19 DIAGNOSIS — Z0000 Encounter for general adult medical examination without abnormal findings: Secondary | ICD-10-CM

## 2011-08-19 DIAGNOSIS — E119 Type 2 diabetes mellitus without complications: Secondary | ICD-10-CM

## 2011-08-19 DIAGNOSIS — R87612 Low grade squamous intraepithelial lesion on cytologic smear of cervix (LGSIL): Secondary | ICD-10-CM

## 2011-08-19 MED ORDER — NORETHINDRONE 0.35 MG PO TABS: 1.0000 | ORAL_TABLET | Freq: Every day | ORAL | Status: DC

## 2011-08-19 NOTE — Progress Notes (Signed)
Last Pap: 08/17/2010 WNL: Yes Regular Periods:yes Contraception: pill micronor  Monthly Breast exam:no Tetanus<58yrs:no Nl.Bladder Function:yes Daily BMs:yes Healthy Diet:yes Calcium:yes Mammogram:no Exercise:yes Seatbelt: yes Abuse at home: no Stressful work:no Sigmoid-colonoscopy: no Bone Density: No [name] Pt without complaint Pt without complaints Physical Examination: General appearance - alert, well appearing, and in no distress Mental status - normal mood, behavior, speech, dress, motor activity, and thought processes Neck - supple, no significant adenopathy, thyroid exam: thyroid is normal in size without nodules or tenderness Chest - clear to auscultation, no wheezes, rales or rhonchi, symmetric air entry Heart - normal rate and regular rhythm Abdomen - soft, nontender, nondistended, no masses or organomegaly Breasts - breasts appear normal, no suspicious masses, no skin or nipple changes or axillary nodes Pelvic - normal external genitalia, vulva, vagina, cervix, uterus and adnexa Rectal - normal rectal, no masses, no visible warts Back exam - full range of motion, no tenderness, palpable spasm or pain on motion Neurological - alert, oriented, normal speech, no focal findings or movement disorder noted Musculoskeletal - no joint tenderness, deformity or swelling Extremities - no edema, redness or tenderness in the calves or thighs Skin - normal coloration and turgor, no rashes, no suspicious skin lesions noted Routine exam Pap sent pt with history of abnormal pap Mammogram due no micronor RT 3yr

## 2011-08-23 LAB — PAP IG, CT-NG, RFX HPV ASCU

## 2012-11-23 LAB — OB RESULTS CONSOLE GBS: GBS: NEGATIVE

## 2012-12-03 LAB — OB RESULTS CONSOLE GC/CHLAMYDIA
CHLAMYDIA, DNA PROBE: NEGATIVE
GC PROBE AMP, GENITAL: NEGATIVE

## 2012-12-03 LAB — OB RESULTS CONSOLE ANTIBODY SCREEN: ANTIBODY SCREEN: NEGATIVE

## 2012-12-03 LAB — OB RESULTS CONSOLE RUBELLA ANTIBODY, IGM: Rubella: IMMUNE

## 2012-12-03 LAB — OB RESULTS CONSOLE ABO/RH: RH Type: POSITIVE

## 2012-12-03 LAB — OB RESULTS CONSOLE HIV ANTIBODY (ROUTINE TESTING): HIV: NONREACTIVE

## 2012-12-03 LAB — OB RESULTS CONSOLE RPR: RPR: NONREACTIVE

## 2012-12-03 LAB — OB RESULTS CONSOLE HEPATITIS B SURFACE ANTIGEN: Hepatitis B Surface Ag: NEGATIVE

## 2013-04-18 NOTE — L&D Delivery Note (Signed)
Delivery Note At 4:37 PM a viable female was delivered via VBAC, Spontaneous (Presentation: ;  ).  APGAR:8 ,9 ; weight .   Placenta status: , .  Cord:  with the following complications: .  Cord pH: not sent  Anesthesia: Epidural  Episiotomy:  Lacerations: first degree Suture Repair: 2.0 chromic Est. Blood Loss (mL): 250cc  Mom to postpartum.  Baby to Couplet care / Skin to Skin.  Kirsten Mckone A 07/03/2013, 4:58 PM

## 2013-06-12 LAB — OB RESULTS CONSOLE GBS: STREP GROUP B AG: NEGATIVE

## 2013-06-17 ENCOUNTER — Encounter: Payer: Self-pay | Admitting: *Deleted

## 2013-06-26 ENCOUNTER — Telehealth (HOSPITAL_COMMUNITY): Payer: Self-pay | Admitting: *Deleted

## 2013-06-26 NOTE — Telephone Encounter (Signed)
Preadmission screen  

## 2013-07-02 ENCOUNTER — Encounter (HOSPITAL_COMMUNITY): Payer: Self-pay | Admitting: *Deleted

## 2013-07-02 ENCOUNTER — Inpatient Hospital Stay (HOSPITAL_COMMUNITY): Payer: BC Managed Care – PPO

## 2013-07-02 DIAGNOSIS — I10 Essential (primary) hypertension: Secondary | ICD-10-CM | POA: Insufficient documentation

## 2013-07-02 DIAGNOSIS — O34219 Maternal care for unspecified type scar from previous cesarean delivery: Secondary | ICD-10-CM | POA: Insufficient documentation

## 2013-07-02 DIAGNOSIS — O09299 Supervision of pregnancy with other poor reproductive or obstetric history, unspecified trimester: Secondary | ICD-10-CM | POA: Insufficient documentation

## 2013-07-02 DIAGNOSIS — O09529 Supervision of elderly multigravida, unspecified trimester: Secondary | ICD-10-CM | POA: Insufficient documentation

## 2013-07-02 NOTE — H&P (Signed)
Fleet ContrasRachel Lovelace-Baumgardner is a 38 y.o. female, [id number] at 5339 5/7 weeks, presenting for induction due to diabetes.  Patient Active Problem List   Diagnosis Date Noted  . Previous cesarean delivery, antepartum condition or complication--3 subsequent VBACs 07/02/2013  . H/O macrosomia in infant in prior pregnancy, currently pregnant 07/02/2013  . Severe obesity (BMI >= 40) 07/02/2013  . DM (diabetes mellitus) 08/19/2011  . LGSIL (low grade squamous intraepithelial dysplasia) 08/19/2011    History of present pregnancy: Patient entered care at 11 5/7 weeks.   EDC of 07/07/13 was established by LMP   Anatomy scan:  20 1/7 weeks, with limited anatomy and an posterior placenta.   Additional US evaluations:   Fetal echo WNL at 20 weeks 27 1/7 weeks:  EFW 2+7, 1120 gm, 69%ile, cervix closed, normal fluid 32 weeks:  2189 gm, 4+13, 78%ile, BPP 8/8.   Weekly BPP/AFI for antental testing, all WNL 35 1/7 weeks:  EFW 6+11, 86%ile, normal fluid, vtx. 36 1/7 weeks:  AFI 20.01, BPP WNL 37 weeks:  Normal AFI 38 3/7 weeks:  EFW 3693 gm, 90%ile, AFI 17.1, BPP 8/8    Significant prenatal events:  On Labetalol 100mg  po BID throughout pregnancy.  On Metformin prior to pregnancy, with change to insulin in 1st trimester. Tdap 04/08/13, flu vaccine at PCP 01/2013.  By end of pregnancy, she was on Novolog 10-15 u with meals, NPH 34 u hs.   Last evaluation:  3/11/5:  No VE, CBGs WNL, with growth 90%ile, normal fluid, BPP 8/8.  VBAC consent signed 05/27/13  OB History   Grav Para Term Preterm Abortions TAB SAB Ect Mult Living   5 4 4       4     2004--Primary LTCS due to breech, 38 weeks, female, 6+7, [facility]ie[facility] failed [facility], elevated BP, low fluid 2006--VBAC, 38 3/7 weeks, 11 hour labor, 7+6, female, epidural, [facility] 2008--VBAC, 40 3/7 weeks, 9+10, female, epidural, [facility], induced 2011--VBAC, 38 6/7 weeks, 7+13, female, epidural, [facility], Dr. Normand Sloopillard, induced due to Type 2 diabetes  Past Medical History   Diagnosis Date  . Dysplasia of cervix, low grade (CIN 1) 10/21/05  . H/O varicella   . Abnormal Pap smear 2007    LGSIL/CIN1  . Complication of anesthesia     Patient says she's sick after waking up  . Macrosomia     H/O  . High BMI   . Diabetes mellitus     insulin now, metformin when not pregnant  . Pregnancy induced hypertension    Past Surgical History  Procedure Laterality Date  . Carpal tunnel release  1998    x 2  . Tonsillectomy and adenoidectomy      age 38  . Cesarean section  2004   Family History: family history includes Cancer in her maternal grandfather and mother; Diabetes in her father and paternal grandfather; Heart disease in her father and paternal grandfather; Hypertension in her paternal grandmother.  Social History:  reports that she has never smoked. She has never used smokeless tobacco. She reports that she does not drink alcohol or use illicit drugs. Husband, Ivin BootyJoshua, is involved and supportive.  [facility]   Prenatal Transfer Tool  Maternal Diabetes: Type 2--on insulin Genetic Screening: Declined Maternal Ultrasounds/Referrals: Normal Fetal Ultrasounds or other Referrals:  Fetal echo--WNL Maternal Substance Abuse:  No Significant Maternal Medications:  Meds include: Other:  Novalog 10-15 u with meals per CBGs, NPH 34 u at hs; Labetalol 100 mg po BID. Significant Maternal  Lab Results: Lab values include: Group B Strep negative  ROS:  +FM, occasional contractions.  No Known Allergies     Last menstrual period 09/28/2012, currently breastfeeding.  Chest clear Heart RRR without murmur Abd gravid, NT, FH 41 cm Pelvic: Last exam 06/18/13--1 cm, thick, vtx, -3. Ext: WNL  FHR: 135 at last visit UCs:  Occasional per patient report.  Prenatal labs: ABO, Rh: A/Positive/-- (08/18 0000) Antibody: Negative (08/18 0000) Rubella:   Immune RPR: Nonreactive (08/18 0000)  HBsAg: Negative (08/18 0000)  HIV: Non-reactive (08/18 0000)  GBS: Negative  (02/25 0000) Sickle cell/Hgb electrophoresis:  NA Pap:  09/2012 WNL GC:  Negative 12/03/12 Chlamydia:  Negative 12/03/12 Genetic screenings:  Declined Glucola:  NA Other:  Hgb A1C 5.5 on 2/9 Hgb 12.4   Assessment/Plan: IUP at 39 4/7 weeks Type 2 diabetes--on insulin Chronic hypertension--Labetalol 100 mg po BID. GBS negative Hx LGA infant Previous C/S, with subsequent VBACs x 3--desires VBAC  Plan: Admit to Birthing Suite per consult with Dr. Normand Sloop Routine [facility] orders Plan foley bulb induction per Dr. Normand Sloop Pain medication prn Glucommander Patient understands the R&B of VBAC, including risks of uterine rupture, lack of progress, need for repeat C/S, and wishes to proceed with trial of labor.  Nyra Capes, [place] 07/02/2013, 9:55 PM

## 2013-07-03 ENCOUNTER — Encounter (HOSPITAL_COMMUNITY): Payer: Self-pay

## 2013-07-03 ENCOUNTER — Inpatient Hospital Stay (HOSPITAL_COMMUNITY)
Admission: RE | Admit: 2013-07-03 | Discharge: 2013-07-04 | DRG: 774 | Disposition: A | Discharge status: Home / Self Care | Payer: BC Managed Care – PPO | Source: Ambulatory Visit | Attending: Obstetrics and Gynecology | Admitting: Obstetrics and Gynecology

## 2013-07-03 ENCOUNTER — Inpatient Hospital Stay (HOSPITAL_COMMUNITY): Payer: BC Managed Care – PPO | Admitting: Anesthesiology

## 2013-07-03 ENCOUNTER — Encounter (HOSPITAL_COMMUNITY): Payer: BC Managed Care – PPO | Admitting: Anesthesiology

## 2013-07-03 VITALS — BP 140/84 | HR 82 | Temp 97.9°F | Resp 18 | Ht 66.5 in | Wt 282.0 lb

## 2013-07-03 DIAGNOSIS — I10 Essential (primary) hypertension: Secondary | ICD-10-CM

## 2013-07-03 DIAGNOSIS — O1002 Pre-existing essential hypertension complicating childbirth: Secondary | ICD-10-CM | POA: Diagnosis present

## 2013-07-03 DIAGNOSIS — Z794 Long term (current) use of insulin: Secondary | ICD-10-CM

## 2013-07-03 DIAGNOSIS — D649 Anemia, unspecified: Secondary | ICD-10-CM | POA: Diagnosis not present

## 2013-07-03 DIAGNOSIS — Z6841 Body Mass Index (BMI) 40.0 and over, adult: Secondary | ICD-10-CM

## 2013-07-03 DIAGNOSIS — O2432 Unspecified pre-existing diabetes mellitus in childbirth: Principal | ICD-10-CM | POA: Diagnosis present

## 2013-07-03 DIAGNOSIS — O9903 Anemia complicating the puerperium: Secondary | ICD-10-CM | POA: Diagnosis not present

## 2013-07-03 DIAGNOSIS — O09529 Supervision of elderly multigravida, unspecified trimester: Secondary | ICD-10-CM | POA: Diagnosis present

## 2013-07-03 DIAGNOSIS — E669 Obesity, unspecified: Secondary | ICD-10-CM | POA: Diagnosis present

## 2013-07-03 DIAGNOSIS — O09299 Supervision of pregnancy with other poor reproductive or obstetric history, unspecified trimester: Secondary | ICD-10-CM

## 2013-07-03 DIAGNOSIS — O99214 Obesity complicating childbirth: Secondary | ICD-10-CM

## 2013-07-03 DIAGNOSIS — E119 Type 2 diabetes mellitus without complications: Secondary | ICD-10-CM | POA: Diagnosis present

## 2013-07-03 DIAGNOSIS — O34219 Maternal care for unspecified type scar from previous cesarean delivery: Secondary | ICD-10-CM | POA: Diagnosis present

## 2013-07-03 LAB — CBC
HCT: 33.9 % — ABNORMAL LOW (ref 36.0–46.0)
HEMATOCRIT: 35.3 % — AB (ref 36.0–46.0)
Hemoglobin: 11.4 g/dL — ABNORMAL LOW (ref 12.0–15.0)
Hemoglobin: 11.8 g/dL — ABNORMAL LOW (ref 12.0–15.0)
MCH: 28.4 pg (ref 26.0–34.0)
MCH: 28.1 pg (ref 26.0–34.0)
MCHC: 33.6 g/dL (ref 30.0–36.0)
MCHC: 33.4 g/dL (ref 30.0–36.0)
MCV: 84.3 fL (ref 78.0–100.0)
MCV: 84 fL (ref 78.0–100.0)
Platelets: 189 10*3/uL (ref 150–400)
Platelets: 175 10*3/uL (ref 150–400)
RBC: 4.02 MIL/uL (ref 3.87–5.11)
RBC: 4.2 MIL/uL (ref 3.87–5.11)
RDW: 15.2 % (ref 11.5–15.5)
RDW: 15.2 % (ref 11.5–15.5)
WBC: 12.9 10*3/uL — ABNORMAL HIGH (ref 4.0–10.5)
WBC: 13.8 10*3/uL — AB (ref 4.0–10.5)

## 2013-07-03 LAB — GLUCOSE, CAPILLARY
GLUCOSE-CAPILLARY: 88 mg/dL (ref 70–99)
GLUCOSE-CAPILLARY: 81 mg/dL (ref 70–99)
Glucose-Capillary: 99 mg/dL (ref 70–99)
Glucose-Capillary: 84 mg/dL (ref 70–99)
Glucose-Capillary: 80 mg/dL (ref 70–99)
Glucose-Capillary: 83 mg/dL (ref 70–99)

## 2013-07-03 LAB — TYPE AND SCREEN
ABO/RH(D): A POS
ANTIBODY SCREEN: NEGATIVE

## 2013-07-03 LAB — RPR: RPR Ser Ql: NONREACTIVE

## 2013-07-03 LAB — ABO/RH: ABO/RH(D): A POS

## 2013-07-03 MED ORDER — SENNOSIDES-DOCUSATE SODIUM 8.6-50 MG PO TABS
2.0000 | ORAL_TABLET | ORAL | Status: DC
  Administered 2013-07-04: 2 via ORAL
  Filled 2013-07-03: qty 2

## 2013-07-03 MED ORDER — PHENYLEPHRINE 40 MCG/ML (10ML) SYRINGE FOR IV PUSH (FOR BLOOD PRESSURE SUPPORT)
80.0000 ug | PREFILLED_SYRINGE | INTRAVENOUS | Status: DC | PRN
  Filled 2013-07-03: qty 2

## 2013-07-03 MED ORDER — PHENYLEPHRINE 40 MCG/ML (10ML) SYRINGE FOR IV PUSH (FOR BLOOD PRESSURE SUPPORT)
80.0000 ug | PREFILLED_SYRINGE | INTRAVENOUS | Status: DC | PRN
  Filled 2013-07-03: qty 10
  Filled 2013-07-03: qty 2

## 2013-07-03 MED ORDER — LANOLIN HYDROUS EX OINT: TOPICAL_OINTMENT | CUTANEOUS | Status: DC | PRN

## 2013-07-03 MED ORDER — PRENATAL MULTIVITAMIN CH
1.0000 | ORAL_TABLET | Freq: Every day | ORAL | Status: DC
  Administered 2013-07-04: 1 via ORAL
  Filled 2013-07-03: qty 1

## 2013-07-03 MED ORDER — LACTATED RINGERS IV SOLN
500.0000 mL | Freq: Once | INTRAVENOUS | Status: AC
  Administered 2013-07-03: 500 mL via INTRAVENOUS

## 2013-07-03 MED ORDER — ZOLPIDEM TARTRATE 5 MG PO TABS: 5.0000 mg | ORAL_TABLET | Freq: Every evening | ORAL | Status: DC | PRN

## 2013-07-03 MED ORDER — DEXTROSE 50 % IV SOLN: 25.0000 mL | INTRAVENOUS | Status: DC | PRN

## 2013-07-03 MED ORDER — DIPHENHYDRAMINE HCL 25 MG PO CAPS: 25.0000 mg | ORAL_CAPSULE | Freq: Four times a day (QID) | ORAL | Status: DC | PRN

## 2013-07-03 MED ORDER — OXYCODONE-ACETAMINOPHEN 5-325 MG PO TABS
1.0000 | ORAL_TABLET | ORAL | Status: DC | PRN
  Administered 2013-07-03 – 2013-07-04 (×2): 1 via ORAL
  Filled 2013-07-03 (×2): qty 1

## 2013-07-03 MED ORDER — ACETAMINOPHEN 325 MG PO TABS: 650.0000 mg | ORAL_TABLET | ORAL | Status: DC | PRN

## 2013-07-03 MED ORDER — EPHEDRINE 5 MG/ML INJ
10.0000 mg | INTRAVENOUS | Status: DC | PRN
  Filled 2013-07-03: qty 2

## 2013-07-03 MED ORDER — OXYCODONE-ACETAMINOPHEN 5-325 MG PO TABS: 1.0000 | ORAL_TABLET | ORAL | Status: DC | PRN

## 2013-07-03 MED ORDER — OXYTOCIN 40 UNITS IN LACTATED RINGERS INFUSION - SIMPLE MED: 62.5000 mL/h | INTRAVENOUS | Status: DC

## 2013-07-03 MED ORDER — ONDANSETRON HCL 4 MG/2ML IJ SOLN: 4.0000 mg | INTRAMUSCULAR | Status: DC | PRN

## 2013-07-03 MED ORDER — LACTATED RINGERS IV SOLN
INTRAVENOUS | Status: DC
  Administered 2013-07-03 (×2): via INTRAVENOUS

## 2013-07-03 MED ORDER — DIBUCAINE 1 % RE OINT: 1.0000 "application " | TOPICAL_OINTMENT | RECTAL | Status: DC | PRN

## 2013-07-03 MED ORDER — CITRIC ACID-SODIUM CITRATE 334-500 MG/5ML PO SOLN: 30.0000 mL | ORAL | Status: DC | PRN

## 2013-07-03 MED ORDER — OXYTOCIN BOLUS FROM INFUSION
500.0000 mL | INTRAVENOUS | Status: DC
  Administered 2013-07-03: 500 mL via INTRAVENOUS

## 2013-07-03 MED ORDER — TERBUTALINE SULFATE 1 MG/ML IJ SOLN: 0.2500 mg | Freq: Once | INTRAMUSCULAR | Status: DC | PRN

## 2013-07-03 MED ORDER — OXYTOCIN 40 UNITS IN LACTATED RINGERS INFUSION - SIMPLE MED
1.0000 m[IU]/min | INTRAVENOUS | Status: DC
  Administered 2013-07-03: 2 m[IU]/min via INTRAVENOUS
  Filled 2013-07-03: qty 1000

## 2013-07-03 MED ORDER — MEASLES, MUMPS & RUBELLA VAC ~~LOC~~ INJ
0.5000 mL | INJECTION | Freq: Once | SUBCUTANEOUS | Status: DC
  Filled 2013-07-03: qty 0.5

## 2013-07-03 MED ORDER — SIMETHICONE 80 MG PO CHEW: 80.0000 mg | CHEWABLE_TABLET | ORAL | Status: DC | PRN

## 2013-07-03 MED ORDER — METFORMIN HCL 500 MG PO TABS
500.0000 mg | ORAL_TABLET | Freq: Two times a day (BID) | ORAL | Status: DC
  Administered 2013-07-04 (×2): 500 mg via ORAL
  Filled 2013-07-03 (×5): qty 1

## 2013-07-03 MED ORDER — SODIUM CHLORIDE 0.9 % IV SOLN
INTRAVENOUS | Status: DC
  Administered 2013-07-03: 0.4 [IU]/h via INTRAVENOUS
  Filled 2013-07-03: qty 1

## 2013-07-03 MED ORDER — SODIUM CHLORIDE 0.9 % IV SOLN: INTRAVENOUS | Status: DC

## 2013-07-03 MED ORDER — FLEET ENEMA 7-19 GM/118ML RE ENEM: 1.0000 | ENEMA | RECTAL | Status: DC | PRN

## 2013-07-03 MED ORDER — BENZOCAINE-MENTHOL 20-0.5 % EX AERO: 1.0000 "application " | INHALATION_SPRAY | CUTANEOUS | Status: DC | PRN

## 2013-07-03 MED ORDER — DIPHENHYDRAMINE HCL 50 MG/ML IJ SOLN: 12.5000 mg | INTRAMUSCULAR | Status: DC | PRN

## 2013-07-03 MED ORDER — ONDANSETRON HCL 4 MG PO TABS: 4.0000 mg | ORAL_TABLET | ORAL | Status: DC | PRN

## 2013-07-03 MED ORDER — IBUPROFEN 600 MG PO TABS
600.0000 mg | ORAL_TABLET | Freq: Four times a day (QID) | ORAL | Status: DC
  Administered 2013-07-04 (×4): 600 mg via ORAL
  Filled 2013-07-03 (×4): qty 1

## 2013-07-03 MED ORDER — ONDANSETRON HCL 4 MG/2ML IJ SOLN: 4.0000 mg | Freq: Four times a day (QID) | INTRAMUSCULAR | Status: DC | PRN

## 2013-07-03 MED ORDER — TETANUS-DIPHTH-ACELL PERTUSSIS 5-2.5-18.5 LF-MCG/0.5 IM SUSP: 0.5000 mL | Freq: Once | INTRAMUSCULAR | Status: DC

## 2013-07-03 MED ORDER — LIDOCAINE HCL (PF) 1 % IJ SOLN
INTRAMUSCULAR | Status: DC | PRN
  Administered 2013-07-03 (×2): 5 mL

## 2013-07-03 MED ORDER — LIDOCAINE HCL (PF) 1 % IJ SOLN
30.0000 mL | INTRAMUSCULAR | Status: DC | PRN
  Filled 2013-07-03: qty 30

## 2013-07-03 MED ORDER — DEXTROSE IN LACTATED RINGERS 5 % IV SOLN
INTRAVENOUS | Status: DC
  Administered 2013-07-03: 11:00:00 via INTRAVENOUS

## 2013-07-03 MED ORDER — WITCH HAZEL-GLYCERIN EX PADS
1.0000 | MEDICATED_PAD | CUTANEOUS | Status: DC | PRN
Start: 2013-07-03 — End: 2013-07-04

## 2013-07-03 MED ORDER — FENTANYL 2.5 MCG/ML BUPIVACAINE 1/10 % EPIDURAL INFUSION (WH - ANES)
14.0000 mL/h | INTRAMUSCULAR | Status: DC | PRN
  Administered 2013-07-03: 14 mL/h via EPIDURAL
  Filled 2013-07-03: qty 125

## 2013-07-03 MED ORDER — LACTATED RINGERS IV SOLN: 500.0000 mL | INTRAVENOUS | Status: DC | PRN

## 2013-07-03 MED ORDER — LABETALOL HCL 100 MG PO TABS
100.0000 mg | ORAL_TABLET | Freq: Two times a day (BID) | ORAL | Status: DC
  Administered 2013-07-03 – 2013-07-04 (×2): 100 mg via ORAL
  Filled 2013-07-03 (×5): qty 1

## 2013-07-03 MED ORDER — INSULIN REGULAR BOLUS VIA INFUSION
0.0000 [IU] | Freq: Three times a day (TID) | INTRAVENOUS | Status: DC
  Filled 2013-07-03: qty 10

## 2013-07-03 MED ORDER — EPHEDRINE 5 MG/ML INJ
10.0000 mg | INTRAVENOUS | Status: DC | PRN
  Filled 2013-07-03: qty 2
  Filled 2013-07-03: qty 4

## 2013-07-03 MED ORDER — IBUPROFEN 600 MG PO TABS
600.0000 mg | ORAL_TABLET | Freq: Four times a day (QID) | ORAL | Status: DC | PRN
  Administered 2013-07-03: 600 mg via ORAL
  Filled 2013-07-03: qty 1

## 2013-07-03 MED ORDER — SODIUM CHLORIDE 0.9 % IV SOLN
INTRAVENOUS | Status: DC
  Filled 2013-07-03: qty 1

## 2013-07-03 NOTE — Anesthesia Procedure Notes (Signed)
Epidural Patient location during procedure: OB Start time: 07/03/2013 12:49 PM  Staffing Anesthesiologist: Brayton CavesJACKSON, Arieh Bogue Performed by: anesthesiologist   Preanesthetic Checklist Completed: patient identified, site marked, surgical consent, pre-op evaluation, timeout performed, IV checked, risks and benefits discussed and monitors and equipment checked  Epidural Patient position: sitting Prep: site prepped and draped and DuraPrep Patient monitoring: continuous pulse ox and blood pressure Approach: midline Injection technique: LOR air  Needle:  Needle type: Tuohy  Needle gauge: 17 G Needle length: 9 cm and 9 Needle insertion depth: 7 cm Catheter type: closed end flexible Catheter size: 19 Gauge Catheter at skin depth: 12 cm Test dose: negative  Assessment Events: blood not aspirated, injection not painful, no injection resistance, negative IV test and no paresthesia  Additional Notes Patient identified.  Risk benefits discussed including failed block, incomplete pain control, headache, nerve damage, paralysis, blood pressure changes, nausea, vomiting, reactions to medication both toxic or allergic, and postpartum back pain.  Patient expressed understanding and wished to proceed.  All questions were answered.  Sterile technique used throughout procedure and epidural site dressed with sterile barrier dressing. No paresthesia or other complications noted.The patient did not experience any signs of intravascular injection such as tinnitus or metallic taste in mouth nor signs of intrathecal spread such as rapid motor block. Please see nursing notes for vital signs.

## 2013-07-03 NOTE — Anesthesia Preprocedure Evaluation (Signed)
Anesthesia Evaluation  Patient identified by MRN, date of birth, ID band Patient awake    Reviewed: Allergy & Precautions, H&P , Patient's Chart, lab work & pertinent test results  Airway Mallampati: III TM Distance: >3 FB Neck ROM: full    Dental   Pulmonary  breath sounds clear to auscultation        Cardiovascular hypertension, Rhythm:regular Rate:Normal     Neuro/Psych    GI/Hepatic   Endo/Other  diabetesMorbid obesity  Renal/GU      Musculoskeletal   Abdominal   Peds  Hematology   Anesthesia Other Findings   Reproductive/Obstetrics (+) Pregnancy                           Anesthesia Physical Anesthesia Plan  ASA: III  Anesthesia Plan: Epidural   Post-op Pain Management:    Induction:   Airway Management Planned:   Additional Equipment:   Intra-op Plan:   Post-operative Plan:   Informed Consent: I have reviewed the patients History and Physical, chart, labs and discussed the procedure including the risks, benefits and alternatives for the proposed anesthesia with the patient or authorized representative who has indicated his/her understanding and acceptance.     Plan Discussed with:   Anesthesia Plan Comments:         Anesthesia Quick Evaluation  

## 2013-07-03 NOTE — Progress Notes (Signed)
Patient ID: [name], female   DOB: 04/09/1976, [age] y.o.   MRN: [id number] Pt here for induciton due to DM and HTN Category 1 NST toco irregular contractions BP 152/75  Pulse 86  Temp(Src) 98.2 F (36.8 C) (Oral)  Resp 18  Ht 5' 6.5" (1.689 m)  Wt 127.914 kg (282 lb)  BMI 44.84 kg/m2  SpO2 98%  LMP 09/28/2012 BS is normal 3/50/-1 AROM clear Pitocin induction Anticipate SVD

## 2013-07-04 LAB — CBC
HEMATOCRIT: 34 % — AB (ref 36.0–46.0)
Hemoglobin: 11 g/dL — ABNORMAL LOW (ref 12.0–15.0)
MCH: 27.6 pg (ref 26.0–34.0)
MCHC: 32.4 g/dL (ref 30.0–36.0)
MCV: 85.2 fL (ref 78.0–100.0)
PLATELETS: 176 10*3/uL (ref 150–400)
RBC: 3.99 MIL/uL (ref 3.87–5.11)
RDW: 15.4 % (ref 11.5–15.5)
WBC: 11.4 10*3/uL — AB (ref 4.0–10.5)

## 2013-07-04 LAB — GLUCOSE, CAPILLARY
GLUCOSE-CAPILLARY: 162 mg/dL — AB (ref 70–99)
Glucose-Capillary: 100 mg/dL — ABNORMAL HIGH (ref 70–99)
Glucose-Capillary: 128 mg/dL — ABNORMAL HIGH (ref 70–99)

## 2013-07-04 MED ORDER — METFORMIN HCL 500 MG PO TABS: 500.0000 mg | ORAL_TABLET | Freq: Two times a day (BID) | ORAL | Status: DC

## 2013-07-04 MED ORDER — DOCUSATE SODIUM 100 MG PO CAPS: 100.0000 mg | ORAL_CAPSULE | Freq: Two times a day (BID) | ORAL | Status: DC

## 2013-07-04 MED ORDER — NORETHINDRONE 0.35 MG PO TABS: 1.0000 | ORAL_TABLET | Freq: Every day | ORAL | Status: DC

## 2013-07-04 MED ORDER — IBUPROFEN 800 MG PO TABS: 800.0000 mg | ORAL_TABLET | Freq: Three times a day (TID) | ORAL | Status: DC | PRN

## 2013-07-04 MED ORDER — FERROUS SULFATE 325 (65 FE) MG PO TABS: 325.0000 mg | ORAL_TABLET | Freq: Two times a day (BID) | ORAL | Status: DC

## 2013-07-04 MED ORDER — OXYCODONE-ACETAMINOPHEN 5-325 MG PO TABS: 1.0000 | ORAL_TABLET | ORAL | Status: DC | PRN

## 2013-07-04 NOTE — Discharge Instructions (Signed)
Postpartum Depression and Baby Blues °The postpartum period begins right after the birth of a baby. During this time, there is often a great amount of joy and excitement. It is also a time of considerable changes in the life of the parent(s). Regardless of how many times a mother gives birth, each child brings new challenges and dynamics to the family. It is not unusual to have feelings of excitement accompanied by confusing shifts in moods, emotions, and thoughts. All mothers are at risk of developing postpartum depression or the "baby blues." These mood changes can occur right after giving birth, or they may occur many months after giving birth. The baby blues or postpartum depression can be mild or severe. Additionally, postpartum depression can resolve rather quickly, or it can be a long-term condition. °CAUSES °Elevated hormones and their rapid decline are thought to be a main cause of postpartum depression and the baby blues. There are a number of hormones that radically change during and after pregnancy. Estrogen and progesterone usually decrease immediately after delivering your baby. The level of thyroid hormone and various cortisol steroids also rapidly drop. Other factors that play a major role in these changes include major life events and genetics.  °RISK FACTORS °If you have any of the following risks for the baby blues or postpartum depression, know what symptoms to watch out for during the postpartum period. Risk factors that may increase the likelihood of getting the baby blues or postpartum depression include: °· Having a personal or family history of depression. °· Having depression while being pregnant. °· Having premenstrual or oral contraceptive-associated mood issues. °· Having exceptional life stress. °· Having marital conflict. °· Lacking a social support network. °· Having a baby with special needs. °· Having health problems such as diabetes. °SYMPTOMS °Baby blues symptoms include: °· Brief  fluctuations in mood, such as going from extreme happiness to sadness. °· Decreased concentration. °· Difficulty sleeping. °· Crying spells, tearfulness. °· Irritability. °· Anxiety. °Postpartum depression symptoms typically begin within the first month after giving birth. These symptoms include: °· Difficulty sleeping or excessive sleepiness. °· Marked weight loss. °· Agitation. °· Feelings of worthlessness. °· Lack of interest in activity or food. °Postpartum psychosis is a very concerning condition and can be dangerous. Fortunately, it is rare. Displaying any of the following symptoms is cause for immediate medical attention. Postpartum psychosis symptoms include: °· Hallucinations and delusions. °· Bizarre or disorganized behavior. °· Confusion or disorientation. °DIAGNOSIS  °A diagnosis is made by an evaluation of your symptoms. There are no medical or lab tests that lead to a diagnosis, but there are various questionnaires that a caregiver may use to identify those with the baby blues, postpartum depression, or psychosis. Often times, a screening tool called the Edinburgh Postnatal Depression Scale is used to diagnose depression in the postpartum period.  °TREATMENT °The baby blues usually goes away on its own in 1 to 2 weeks. Social support is often all that is needed. You should be encouraged to get adequate sleep and rest. Occasionally, you may be given medicines to help you sleep.  °Postpartum depression requires treatment as it can last several months or longer if it is not treated. Treatment may include individual or group therapy, medicine, or both to address any social, physiological, and psychological factors that may play a role in the depression. Regular exercise, a healthy diet, rest, and social support may also be strongly recommended.  °Postpartum psychosis is more serious and needs treatment right away. Hospitalization is   often needed. °HOME CARE INSTRUCTIONS °· Get as much rest as you can. Nap  when the baby sleeps. °· Exercise regularly. Some women find yoga and walking to be beneficial. °· Eat a balanced and nourishing diet. °· Do little things that you enjoy. Have a cup of tea, take a bubble bath, read your favorite magazine, or listen to your favorite music. °· Avoid alcohol. °· Ask for help with household chores, cooking, grocery shopping, or running errands as needed. Do not try to do everything. °· Talk to people close to you about how you are feeling. Get support from your partner, family members, friends, or other new moms. °· Try to stay positive in how you think. Think about the things you are grateful for. °· Do not spend a lot of time alone. °· Only take medicine as directed by your caregiver. °· Keep all your postpartum appointments. °· Let your caregiver know if you have any concerns. °SEEK MEDICAL CARE IF: °You are having a reaction or problems with your medicine. °SEEK IMMEDIATE MEDICAL CARE IF: °· You have suicidal feelings. °· You feel you may harm the baby or someone else. °Document Released: 01/07/2004 Document Revised: 06/27/2011 Document Reviewed: 01/14/2013 °ExitCare® Patient Information ©2014 [employer]. °Postpartum Care After Vaginal Delivery °After you deliver your newborn (postpartum period), the usual stay in the hospital is 24 72 hours. If there were problems with your labor or delivery, or if you have other medical problems, you might be in the hospital longer.  °While you are in the hospital, you will receive help and instructions on how to care for yourself and your newborn during the postpartum period.  °While you are in the hospital: °· Be sure to tell your nurses if you have pain or discomfort, as well as where you feel the pain and what makes the pain worse. °· If you had an incision made near your vagina (episiotomy) or if you had some tearing during delivery, the nurses may put ice packs on your episiotomy or tear. The ice packs may help to reduce the pain and  swelling. °· If you are breastfeeding, you may feel uncomfortable contractions of your uterus for a couple of weeks. This is normal. The contractions help your uterus get back to normal size. °· It is normal to have some bleeding after delivery. °· For the first 1 3 days after delivery, the flow is red and the amount may be similar to a period. °· It is common for the flow to start and stop. °· In the first few days, you may pass some small clots. Let your nurses know if you begin to pass large clots or your flow increases. °· Do not  flush blood clots down the toilet before having the nurse look at them. °· During the next 3 10 days after delivery, your flow should become more watery and pink or brown-tinged in color. °· Ten to fourteen days after delivery, your flow should be a small amount of yellowish-white discharge. °· The amount of your flow will decrease over the first few weeks after delivery. Your flow may stop in 6 8 weeks. Most women have had their flow stop by 12 weeks after delivery. °· You should change your sanitary pads frequently. °· Wash your hands thoroughly with soap and water for at least 20 seconds after changing pads, using the toilet, or before holding or feeding your newborn. °· You should feel like you need to empty your bladder within the first   6 8 hours after delivery. °· In case you become weak, lightheaded, or faint, call your nurse before you get out of bed for the first time and before you take a shower for the first time. °· Within the first few days after delivery, your breasts may begin to feel tender and full. This is called engorgement. Breast tenderness usually goes away within 48 72 hours after engorgement occurs. You may also notice milk leaking from your breasts. If you are not breastfeeding, do not stimulate your breasts. Breast stimulation can make your breasts produce more milk. °· Spending as much time as possible with your newborn is very important. During this time,  you and your newborn can feel close and get to know each other. Having your newborn stay in your room (rooming in) will help to strengthen the bond with your newborn.  It will give you time to get to know your newborn and become comfortable caring for your newborn. °· Your hormones change after delivery. Sometimes the hormone changes can temporarily cause you to feel sad or tearful. These feelings should not last more than a few days. If these feelings last longer than that, you should talk to your caregiver. °· If desired, talk to your caregiver about methods of family planning or contraception. °· Talk to your caregiver about immunizations. Your caregiver may want you to have the following immunizations before leaving the hospital: °· Tetanus, diphtheria, and pertussis (Tdap) or tetanus and diphtheria (Td) immunization. It is very important that you and your family (including grandparents) or others caring for your newborn are up-to-date with the Tdap or Td immunizations. The Tdap or Td immunization can help protect your newborn from getting ill. °· Rubella immunization. °· Varicella (chickenpox) immunization. °· Influenza immunization. You should receive this annual immunization if you did not receive the immunization during your pregnancy. °Document Released: 01/30/2007 Document Revised: 12/28/2011 Document Reviewed: 11/30/2011 °ExitCare® Patient Information ©2014 [employer]. ° °

## 2013-07-04 NOTE — Lactation Note (Signed)
This note was copied from the chart of [name]. Lactation Consultation Note Initial consult:  Baby Krystal 2917 hours old.  P5   Mother has baby in football hold breastfeeding. Rhythmical sucking observed, some with stimulation. Hand expression reviewed. Reviewed basics, lactation support services and brochure. Encouraged mother to call if she needs assistance. Patient Name: [name] Today's Date: 07/04/2013 Reason for consult: Initial assessment   Maternal Data Has patient been taught Hand Expression?: Yes  Feeding Feeding Type: Breast Fed  LATCH Score/Interventions Latch: Grasps breast easily, tongue down, lips flanged, rhythmical sucking.  Audible Swallowing: A few with stimulation  Type of Nipple: Everted at rest and after stimulation  Comfort (Breast/Nipple): Soft / non-tender     Hold (Positioning): No assistance needed to correctly position infant at breast.  LATCH Score: 9  Lactation Tools Discussed/Used     Consult Status Consult Status: Complete    Hardie PulleyBerkelhammer, Madysyn Hanken Boschen 07/04/2013, 10:23 AM

## 2013-07-04 NOTE — Anesthesia Postprocedure Evaluation (Signed)
  Anesthesia Post-op Note  Patient: [name]  Procedure(s) Performed: * No procedures listed *  Patient Location: PACU and Mother/Baby  Anesthesia Type:Epidural  Level of Consciousness: awake, alert  and oriented  Airway and Oxygen Therapy: Patient Spontanous Breathing  Post-op Pain: none  Post-op Assessment: Post-op Vital signs reviewed, Patient's Cardiovascular Status Stable, No headache, No backache, No residual numbness and No residual motor weakness  Post-op Vital Signs: Reviewed and stable  Complications: No apparent anesthesia complications

## 2013-07-04 NOTE — Progress Notes (Signed)
Post Partum Day 1  Subjective: no complaints and tolerating PO  Objective: Blood pressure 140/84, pulse 82, temperature 97.9 F (36.6 C), temperature source Oral, resp. rate 18, height 5' 6.5" (1.689 m), weight 282 lb (127.914 kg), last menstrual period 09/28/2012, SpO2 96.00%, unknown if currently breastfeeding.  Physical Exam:  General: no distress Lochia: appropriate Uterine Fundus: firm Incision: NA DVT Evaluation: No evidence of DVT seen on physical exam.   Recent Labs  07/03/13 1709 07/04/13 0626  HGB 11.8* 11.0*  HCT 35.3* 34.0*    Assessment/Plan: Breastfeeding and Contraception Prog only OCPs  Anemia  Diabetes  Hypertension  Obesity  Discharge prescriptions have been printed & Signed.  Possible discharge this afternoon.  Circumcision discussed.   LOS: 1 day   [name] V 07/04/2013, 11:37 AM

## 2013-07-05 NOTE — Discharge Summary (Signed)
Vaginal Delivery Discharge Summary  [name]  DOB:    05/29/1975 MRN:    [id number] CSN:    [id number]  Date of admission:                  07/03/2013  Date of discharge:                   07/04/2013  Procedures this admission:  Date of Delivery: 07/03/2013 normal spontaneous vaginal delivery by Dr. Jaymes GraffNaima Dillard  Newborn Data:  Live born female  Birth Weight: 7 lb 13.4 oz (3555 g) APGAR: 9, 9  Home with mother. Name: [name] Circumcision Plan: Inpatient  History of Present Illness:  [name] is a 38 y.o. female, G5P5005, who presents at 4235w5d weeks gestation. [facility]. She was admitted induction of labor. Her pregnancy has been complicated by:   Advanced maternal age Hypertension Diabetes Obesity History of macrosomia Grand multipara Prior cesarean section Anemia.  Hospital course:  The patient was admitted for induction.   Her labor was not complicated. She proceeded to have a vaginal delivery of a healthy infant. Her delivery was not complicated. Her postpartum course was not complicated. Her blood sugars and blood pressures were normal postpartum. She was discharged to home on postpartum day 1 doing well.  Feeding:  breast  Contraception:  oral progesterone-only contraceptive  Discharge hemoglobin:  Hemoglobin  Date Value Ref Range Status  07/04/2013 11.0* 12.0 - 15.0 g/dL Final     HCT  Date Value Ref Range Status  07/04/2013 34.0* 36.0 - 46.0 % Final    Discharge Physical Exam:   General: no distress Lochia: appropriate Uterine Fundus: firm Incision: NA DVT Evaluation: No evidence of DVT seen on physical exam.  Intrapartum Procedures: spontaneous vaginal delivery Postpartum Procedures: none Complications-Operative and Postpartum: First degree perineal laceration  Discharge Diagnoses: Term  Pregnancy-delivered and See admission diagnosis  Discharge Information:  Activity:           pelvic rest Diet:                routine Medications: PNV, Ibuprofen, Colace, Iron, Percocet and Metformin, [name] progesterone only birth control pills Condition:      stable and improved Instructions:   Postpartum Care After Vaginal Delivery  After you deliver your newborn (postpartum period), the usual stay in the hospital is 24 72 hours. If there were problems with your labor or delivery, or if you have other medical problems, you might be in the hospital longer.  While you are in the hospital, you will receive help and instructions on how to care for yourself and your newborn during the postpartum period.  While you are in the hospital:  Be sure to tell your nurses if you have pain or discomfort, as well as where you feel the pain and what makes the pain worse.  If you had an incision made near your vagina (episiotomy) or if you had some tearing during delivery, the nurses may put ice packs on your episiotomy or tear. The ice packs may help to reduce the pain and swelling.  If you are breastfeeding, you may feel uncomfortable contractions of your uterus for a couple of weeks. This is normal. The contractions help your uterus get back to normal size.  It is normal to have some bleeding after delivery.  For the first 1 3 days after delivery, the  flow is red and the amount may be similar to a period.  It is common for the flow to start and stop.  In the first few days, you may pass some small clots. Let your nurses know if you begin to pass large clots or your flow increases.  Do not  flush blood clots down the toilet before having the nurse look at them.  During the next 3 10 days after delivery, your flow should become more watery and pink or brown-tinged in color.  Ten to fourteen days after delivery, your flow should be a small amount of yellowish-white discharge.  The amount of your  flow will decrease over the first few weeks after delivery. Your flow may stop in 6 8 weeks. Most women have had their flow stop by 12 weeks after delivery.  You should change your sanitary pads frequently.  Wash your hands thoroughly with soap and water for at least 20 seconds after changing pads, using the toilet, or before holding or feeding your newborn.  You should feel like you need to empty your bladder within the first 6 8 hours after delivery.  In case you become weak, lightheaded, or faint, call your nurse before you get out of bed for the first time and before you take a shower for the first time.  Within the first few days after delivery, your breasts may begin to feel tender and full. This is called engorgement. Breast tenderness usually goes away within 48 72 hours after engorgement occurs. You may also notice milk leaking from your breasts. If you are not breastfeeding, do not stimulate your breasts. Breast stimulation can make your breasts produce more milk.  Spending as much time as possible with your newborn is very important. During this time, you and your newborn can feel close and get to know each other. Having your newborn stay in your room (rooming in) will help to strengthen the bond with your newborn. It will give you time to get to know your newborn and become comfortable caring for your newborn.  Your hormones change after delivery. Sometimes the hormone changes can temporarily cause you to feel sad or tearful. These feelings should not last more than a few days. If these feelings last longer than that, you should talk to your caregiver.  If desired, talk to your caregiver about methods of family planning or contraception.  Talk to your caregiver about immunizations. Your caregiver may want you to have the following immunizations before leaving the hospital:  Tetanus, diphtheria, and pertussis (Tdap) or tetanus and diphtheria (Td) immunization. It is very important  that you and your family (including grandparents) or others caring for your newborn are up-to-date with the Tdap or Td immunizations. The Tdap or Td immunization can help protect your newborn from getting ill.  Rubella immunization.  Varicella (chickenpox) immunization.  Influenza immunization. You should receive this annual immunization if you did not receive the immunization during your pregnancy. Document Released: 01/30/2007 Document Revised: 12/28/2011 Document Reviewed: 11/30/2011 [facility] Patient Information 2014 [place], [place].   Postpartum Depression and Baby Blues  The postpartum period begins right after the birth of a baby. During this time, there is often a great amount of joy and excitement. It is also a time of considerable changes in the life of the parent(s). Regardless of how many times a mother gives birth, each child brings new challenges and dynamics to the family. It is not unusual to have feelings of excitement accompanied by confusing shifts  in moods, emotions, and thoughts. All mothers are at risk of developing postpartum depression or the "baby blues." These mood changes can occur right after giving birth, or they may occur many months after giving birth. The baby blues or postpartum depression can be mild or severe. Additionally, postpartum depression can resolve rather quickly, or it can be a long-term condition. CAUSES Elevated hormones and their rapid decline are thought to be a main cause of postpartum depression and the baby blues. There are a number of hormones that radically change during and after pregnancy. Estrogen and progesterone usually decrease immediately after delivering your baby. The level of thyroid hormone and various cortisol steroids also rapidly drop. Other factors that play a major role in these changes include major life events and genetics.  RISK FACTORS If you have any of the following risks for the baby blues or postpartum depression, know what  symptoms to watch out for during the postpartum period. Risk factors that may increase the likelihood of getting the baby blues or postpartum depression include:  Havinga personal or family history of depression.  Having depression while being pregnant.  Having premenstrual or oral contraceptive-associated mood issues.  Having exceptional life stress.  Having marital conflict.  Lacking a social support network.  Having a baby with special needs.  Having health problems such as diabetes. SYMPTOMS Baby blues symptoms include:  Brief fluctuations in mood, such as going from extreme happiness to sadness.  Decreased concentration.  Difficulty sleeping.  Crying spells, tearfulness.  Irritability.  Anxiety. Postpartum depression symptoms typically begin within the first month after giving birth. These symptoms include:  Difficulty sleeping or excessive sleepiness.  Marked weight loss.  Agitation.  Feelings of worthlessness.  Lack of interest in activity or food. Postpartum psychosis is a very concerning condition and can be dangerous. Fortunately, it is rare. Displaying any of the following symptoms is cause for immediate medical attention. Postpartum psychosis symptoms include:  Hallucinations and delusions.  Bizarre or disorganized behavior.  Confusion or disorientation. DIAGNOSIS  A diagnosis is made by an evaluation of your symptoms. There are no medical or lab tests that lead to a diagnosis, but there are various questionnaires that a caregiver may use to identify those with the baby blues, postpartum depression, or psychosis. Often times, a screening tool called the [place] Postnatal Depression Scale is used to diagnose depression in the postpartum period.  TREATMENT The baby blues usually goes away on its own in 1 to 2 weeks. Social support is often all that is needed. You should be encouraged to get adequate sleep and rest. Occasionally, you may be given  medicines to help you sleep.  Postpartum depression requires treatment as it can last several months or longer if it is not treated. Treatment may include individual or group therapy, medicine, or both to address any social, physiological, and psychological factors that may play a role in the depression. Regular exercise, a healthy diet, rest, and social support may also be strongly recommended.  Postpartum psychosis is more serious and needs treatment right away. Hospitalization is often needed. HOME CARE INSTRUCTIONS  Get as much rest as you can. Nap when the baby sleeps.  Exercise regularly. Some women find yoga and walking to be beneficial.  Eat a balanced and nourishing diet.  Do little things that you enjoy. Have a cup of tea, take a bubble bath, read your favorite magazine, or listen to your favorite music.  Avoid alcohol.  Ask for help with household chores, cooking,  grocery shopping, or running errands as needed. Do not try to do everything.  Talk to people close to you about how you are feeling. Get support from your partner, family members, friends, or other new moms.  Try to stay positive in how you think. Think about the things you are grateful for.  Do not spend a lot of time alone.  Only take medicine as directed by your caregiver.  Keep all your postpartum appointments.  Let your caregiver know if you have any concerns. SEEK MEDICAL CARE IF: You are having a reaction or problems with your medicine. SEEK IMMEDIATE MEDICAL CARE IF:  You have suicidal feelings.  You feel you may harm the baby or someone else. Document Released: 01/07/2004 Document Revised: 06/27/2011 Document Reviewed: 02/08/2011 [facility] Patient Information 2014 [place], [place].   Discharge to: home  Follow-up Information   Follow up with [facility] In 2 weeks. (Check blood pressure.)    Contact information:   [address] [place] [address]         Janine Limbo 07/05/2013

## 2013-07-07 ENCOUNTER — Inpatient Hospital Stay (HOSPITAL_COMMUNITY): Admission: AD | Admit: 2013-07-07 | Payer: Self-pay | Source: Ambulatory Visit | Admitting: Obstetrics and Gynecology

## 2014-02-17 ENCOUNTER — Encounter (HOSPITAL_COMMUNITY): Payer: Self-pay

## 2016-01-11 ENCOUNTER — Encounter (INDEPENDENT_AMBULATORY_CARE_PROVIDER_SITE_OTHER): Payer: Self-pay

## 2016-05-23 DIAGNOSIS — E1122 Type 2 diabetes mellitus with diabetic chronic kidney disease: Secondary | ICD-10-CM | POA: Diagnosis not present

## 2016-05-23 DIAGNOSIS — I129 Hypertensive chronic kidney disease with stage 1 through stage 4 chronic kidney disease, or unspecified chronic kidney disease: Secondary | ICD-10-CM | POA: Diagnosis not present

## 2016-05-23 DIAGNOSIS — N181 Chronic kidney disease, stage 1: Secondary | ICD-10-CM | POA: Diagnosis not present

## 2016-05-23 DIAGNOSIS — E785 Hyperlipidemia, unspecified: Secondary | ICD-10-CM | POA: Diagnosis not present

## 2016-08-22 DIAGNOSIS — E119 Type 2 diabetes mellitus without complications: Secondary | ICD-10-CM | POA: Diagnosis not present

## 2016-08-22 DIAGNOSIS — N760 Acute vaginitis: Secondary | ICD-10-CM | POA: Diagnosis not present

## 2016-08-22 DIAGNOSIS — Z01419 Encounter for gynecological examination (general) (routine) without abnormal findings: Secondary | ICD-10-CM | POA: Diagnosis not present

## 2016-08-22 DIAGNOSIS — N925 Other specified irregular menstruation: Secondary | ICD-10-CM | POA: Diagnosis not present

## 2016-08-24 DIAGNOSIS — N925 Other specified irregular menstruation: Secondary | ICD-10-CM | POA: Diagnosis not present

## 2016-08-26 DIAGNOSIS — O3680X0 Pregnancy with inconclusive fetal viability, not applicable or unspecified: Secondary | ICD-10-CM | POA: Diagnosis not present

## 2016-08-26 DIAGNOSIS — E119 Type 2 diabetes mellitus without complications: Secondary | ICD-10-CM | POA: Diagnosis not present

## 2016-08-26 DIAGNOSIS — Z3A01 Less than 8 weeks gestation of pregnancy: Secondary | ICD-10-CM | POA: Diagnosis not present

## 2016-09-02 DIAGNOSIS — O26859 Spotting complicating pregnancy, unspecified trimester: Secondary | ICD-10-CM | POA: Diagnosis not present

## 2016-09-28 DIAGNOSIS — O3680X0 Pregnancy with inconclusive fetal viability, not applicable or unspecified: Secondary | ICD-10-CM | POA: Diagnosis not present

## 2016-09-28 DIAGNOSIS — Z3491 Encounter for supervision of normal pregnancy, unspecified, first trimester: Secondary | ICD-10-CM | POA: Diagnosis not present

## 2016-09-28 DIAGNOSIS — Z3A09 9 weeks gestation of pregnancy: Secondary | ICD-10-CM | POA: Diagnosis not present

## 2016-09-28 LAB — OB RESULTS CONSOLE ANTIBODY SCREEN: ANTIBODY SCREEN: NEGATIVE

## 2016-09-28 LAB — OB RESULTS CONSOLE RUBELLA ANTIBODY, IGM: Rubella: IMMUNE

## 2016-09-28 LAB — OB RESULTS CONSOLE GC/CHLAMYDIA
Chlamydia: NEGATIVE
Gonorrhea: NEGATIVE

## 2016-09-28 LAB — OB RESULTS CONSOLE ABO/RH: RH TYPE: POSITIVE

## 2016-09-28 LAB — OB RESULTS CONSOLE HIV ANTIBODY (ROUTINE TESTING): HIV: NONREACTIVE

## 2016-09-28 LAB — OB RESULTS CONSOLE RPR: RPR: NONREACTIVE

## 2016-09-28 LAB — OB RESULTS CONSOLE HEPATITIS B SURFACE ANTIGEN: Hepatitis B Surface Ag: NEGATIVE

## 2016-10-26 DIAGNOSIS — Z3A13 13 weeks gestation of pregnancy: Secondary | ICD-10-CM | POA: Diagnosis not present

## 2016-10-26 DIAGNOSIS — I10 Essential (primary) hypertension: Secondary | ICD-10-CM | POA: Diagnosis not present

## 2016-10-26 DIAGNOSIS — Z3481 Encounter for supervision of other normal pregnancy, first trimester: Secondary | ICD-10-CM | POA: Diagnosis not present

## 2016-12-12 DIAGNOSIS — Z3A20 20 weeks gestation of pregnancy: Secondary | ICD-10-CM | POA: Diagnosis not present

## 2016-12-12 DIAGNOSIS — Z363 Encounter for antenatal screening for malformations: Secondary | ICD-10-CM | POA: Diagnosis not present

## 2016-12-12 DIAGNOSIS — Z369 Encounter for antenatal screening, unspecified: Secondary | ICD-10-CM | POA: Diagnosis not present

## 2016-12-12 DIAGNOSIS — Z3482 Encounter for supervision of other normal pregnancy, second trimester: Secondary | ICD-10-CM | POA: Diagnosis not present

## 2016-12-22 DIAGNOSIS — O09522 Supervision of elderly multigravida, second trimester: Secondary | ICD-10-CM | POA: Diagnosis not present

## 2016-12-22 DIAGNOSIS — O24119 Pre-existing diabetes mellitus, type 2, in pregnancy, unspecified trimester: Secondary | ICD-10-CM | POA: Diagnosis not present

## 2017-01-13 DIAGNOSIS — Z23 Encounter for immunization: Secondary | ICD-10-CM | POA: Diagnosis not present

## 2017-02-06 DIAGNOSIS — Z23 Encounter for immunization: Secondary | ICD-10-CM | POA: Diagnosis not present

## 2017-02-06 DIAGNOSIS — Z3A28 28 weeks gestation of pregnancy: Secondary | ICD-10-CM | POA: Diagnosis not present

## 2017-02-06 DIAGNOSIS — O24119 Pre-existing diabetes mellitus, type 2, in pregnancy, unspecified trimester: Secondary | ICD-10-CM | POA: Diagnosis not present

## 2017-02-06 DIAGNOSIS — Z369 Encounter for antenatal screening, unspecified: Secondary | ICD-10-CM | POA: Diagnosis not present

## 2017-02-06 DIAGNOSIS — I10 Essential (primary) hypertension: Secondary | ICD-10-CM | POA: Diagnosis not present

## 2017-03-08 DIAGNOSIS — O99213 Obesity complicating pregnancy, third trimester: Secondary | ICD-10-CM | POA: Diagnosis not present

## 2017-03-08 DIAGNOSIS — O10019 Pre-existing essential hypertension complicating pregnancy, unspecified trimester: Secondary | ICD-10-CM | POA: Diagnosis not present

## 2017-03-08 DIAGNOSIS — Z3A32 32 weeks gestation of pregnancy: Secondary | ICD-10-CM | POA: Diagnosis not present

## 2017-03-08 DIAGNOSIS — O24419 Gestational diabetes mellitus in pregnancy, unspecified control: Secondary | ICD-10-CM | POA: Diagnosis not present

## 2017-03-08 DIAGNOSIS — Z3493 Encounter for supervision of normal pregnancy, unspecified, third trimester: Secondary | ICD-10-CM | POA: Diagnosis not present

## 2017-03-21 DIAGNOSIS — Z3A34 34 weeks gestation of pregnancy: Secondary | ICD-10-CM | POA: Diagnosis not present

## 2017-03-21 DIAGNOSIS — O99213 Obesity complicating pregnancy, third trimester: Secondary | ICD-10-CM | POA: Diagnosis not present

## 2017-03-31 DIAGNOSIS — Z3A37 37 weeks gestation of pregnancy: Secondary | ICD-10-CM | POA: Diagnosis not present

## 2017-03-31 DIAGNOSIS — O99213 Obesity complicating pregnancy, third trimester: Secondary | ICD-10-CM | POA: Diagnosis not present

## 2017-03-31 DIAGNOSIS — Z3686 Encounter for antenatal screening for cervical length: Secondary | ICD-10-CM | POA: Diagnosis not present

## 2017-03-31 LAB — OB RESULTS CONSOLE GBS: STREP GROUP B AG: NEGATIVE

## 2017-04-05 DIAGNOSIS — O99213 Obesity complicating pregnancy, third trimester: Secondary | ICD-10-CM | POA: Diagnosis not present

## 2017-04-05 DIAGNOSIS — R1012 Left upper quadrant pain: Secondary | ICD-10-CM | POA: Diagnosis not present

## 2017-04-05 DIAGNOSIS — Z3A36 36 weeks gestation of pregnancy: Secondary | ICD-10-CM | POA: Diagnosis not present

## 2017-04-07 ENCOUNTER — Encounter (HOSPITAL_COMMUNITY): Payer: Self-pay | Admitting: *Deleted

## 2017-04-07 ENCOUNTER — Telehealth (HOSPITAL_COMMUNITY): Payer: Self-pay | Admitting: *Deleted

## 2017-04-07 NOTE — Telephone Encounter (Signed)
Preadmission screen  

## 2017-04-12 DIAGNOSIS — Z3A37 37 weeks gestation of pregnancy: Secondary | ICD-10-CM | POA: Diagnosis not present

## 2017-04-12 DIAGNOSIS — R109 Unspecified abdominal pain: Secondary | ICD-10-CM | POA: Diagnosis not present

## 2017-04-12 DIAGNOSIS — O10019 Pre-existing essential hypertension complicating pregnancy, unspecified trimester: Secondary | ICD-10-CM | POA: Diagnosis not present

## 2017-04-14 ENCOUNTER — Other Ambulatory Visit: Payer: Self-pay | Admitting: Obstetrics and Gynecology

## 2017-04-14 DIAGNOSIS — Z3A38 38 weeks gestation of pregnancy: Secondary | ICD-10-CM | POA: Diagnosis not present

## 2017-04-14 DIAGNOSIS — O10019 Pre-existing essential hypertension complicating pregnancy, unspecified trimester: Secondary | ICD-10-CM | POA: Diagnosis not present

## 2017-04-14 DIAGNOSIS — Z3493 Encounter for supervision of normal pregnancy, unspecified, third trimester: Secondary | ICD-10-CM | POA: Diagnosis not present

## 2017-04-17 ENCOUNTER — Inpatient Hospital Stay (HOSPITAL_COMMUNITY)
Admission: AD | Admit: 2017-04-17 | Payer: BLUE CROSS/BLUE SHIELD | Source: Ambulatory Visit | Admitting: Obstetrics and Gynecology

## 2017-04-18 NOTE — L&D Delivery Note (Signed)
Delivery Note At 5:40 PM a viable female was delivered via VBAC, Spontaneous (Presentation: ;  ).  APGAR: 9, 9; weight  .   Placenta status: , .  Cord:  with the following complications: .  Cord pH: not sent  Anesthesia:   Episiotomy: None Lacerations: 1st degree;Perineal Suture Repair: 2.0 chromic Est. Blood Loss (mL):  250 cc  Mom to postpartum.  Baby to Couplet care / Skin to Skin.  Nelva Hauk A 04/20/2017, 6:04 PM

## 2017-04-20 ENCOUNTER — Encounter (HOSPITAL_COMMUNITY): Payer: Self-pay

## 2017-04-20 ENCOUNTER — Inpatient Hospital Stay (HOSPITAL_COMMUNITY): Payer: BLUE CROSS/BLUE SHIELD

## 2017-04-20 ENCOUNTER — Inpatient Hospital Stay (HOSPITAL_COMMUNITY): Payer: BLUE CROSS/BLUE SHIELD | Admitting: Anesthesiology

## 2017-04-20 ENCOUNTER — Inpatient Hospital Stay (HOSPITAL_COMMUNITY)
Admission: RE | Admit: 2017-04-20 | Discharge: 2017-04-22 | DRG: 807 | Disposition: A | Discharge status: Home / Self Care | Payer: BLUE CROSS/BLUE SHIELD | Source: Ambulatory Visit | Attending: Obstetrics and Gynecology | Admitting: Obstetrics and Gynecology

## 2017-04-20 DIAGNOSIS — O34211 Maternal care for low transverse scar from previous cesarean delivery: Secondary | ICD-10-CM | POA: Diagnosis not present

## 2017-04-20 DIAGNOSIS — O329XX0 Maternal care for malpresentation of fetus, unspecified, not applicable or unspecified: Secondary | ICD-10-CM

## 2017-04-20 DIAGNOSIS — E119 Type 2 diabetes mellitus without complications: Secondary | ICD-10-CM | POA: Diagnosis present

## 2017-04-20 DIAGNOSIS — O10913 Unspecified pre-existing hypertension complicating pregnancy, third trimester: Secondary | ICD-10-CM | POA: Diagnosis present

## 2017-04-20 DIAGNOSIS — O09523 Supervision of elderly multigravida, third trimester: Secondary | ICD-10-CM | POA: Diagnosis not present

## 2017-04-20 DIAGNOSIS — Z3A39 39 weeks gestation of pregnancy: Secondary | ICD-10-CM | POA: Diagnosis not present

## 2017-04-20 DIAGNOSIS — O1002 Pre-existing essential hypertension complicating childbirth: Principal | ICD-10-CM | POA: Diagnosis present

## 2017-04-20 DIAGNOSIS — O10013 Pre-existing essential hypertension complicating pregnancy, third trimester: Secondary | ICD-10-CM | POA: Diagnosis not present

## 2017-04-20 DIAGNOSIS — Z794 Long term (current) use of insulin: Secondary | ICD-10-CM

## 2017-04-20 DIAGNOSIS — O34219 Maternal care for unspecified type scar from previous cesarean delivery: Secondary | ICD-10-CM | POA: Diagnosis not present

## 2017-04-20 DIAGNOSIS — O164 Unspecified maternal hypertension, complicating childbirth: Secondary | ICD-10-CM | POA: Diagnosis not present

## 2017-04-20 DIAGNOSIS — O24113 Pre-existing diabetes mellitus, type 2, in pregnancy, third trimester: Secondary | ICD-10-CM | POA: Diagnosis not present

## 2017-04-20 DIAGNOSIS — O133 Gestational [pregnancy-induced] hypertension without significant proteinuria, third trimester: Secondary | ICD-10-CM | POA: Diagnosis not present

## 2017-04-20 LAB — GLUCOSE, CAPILLARY
GLUCOSE-CAPILLARY: 101 mg/dL — AB (ref 65–99)
GLUCOSE-CAPILLARY: 89 mg/dL (ref 65–99)
GLUCOSE-CAPILLARY: 130 mg/dL — AB (ref 65–99)
GLUCOSE-CAPILLARY: 82 mg/dL (ref 65–99)
Glucose-Capillary: 62 mg/dL — ABNORMAL LOW (ref 65–99)
Glucose-Capillary: 80 mg/dL (ref 65–99)
Glucose-Capillary: 50 mg/dL — ABNORMAL LOW (ref 65–99)
Glucose-Capillary: 87 mg/dL (ref 65–99)

## 2017-04-20 LAB — TYPE AND SCREEN
ABO/RH(D): A POS
Antibody Screen: NEGATIVE

## 2017-04-20 LAB — GLUCOSE, RANDOM: Glucose, Bld: 97 mg/dL (ref 65–99)

## 2017-04-20 LAB — CBC
HEMATOCRIT: 33.4 % — AB (ref 36.0–46.0)
Hemoglobin: 11.2 g/dL — ABNORMAL LOW (ref 12.0–15.0)
MCH: 28.6 pg (ref 26.0–34.0)
MCHC: 33.5 g/dL (ref 30.0–36.0)
MCV: 85.2 fL (ref 78.0–100.0)
Platelets: 188 10*3/uL (ref 150–400)
RBC: 3.92 MIL/uL (ref 3.87–5.11)
RDW: 15 % (ref 11.5–15.5)
WBC: 11.1 10*3/uL — ABNORMAL HIGH (ref 4.0–10.5)

## 2017-04-20 LAB — RPR: RPR: NONREACTIVE

## 2017-04-20 MED ORDER — DIBUCAINE 1 % RE OINT: 1.0000 "application " | TOPICAL_OINTMENT | RECTAL | Status: DC | PRN

## 2017-04-20 MED ORDER — OXYTOCIN 40 UNITS IN LACTATED RINGERS INFUSION - SIMPLE MED: 2.5000 [IU]/h | INTRAVENOUS | Status: DC

## 2017-04-20 MED ORDER — ONDANSETRON HCL 4 MG PO TABS: 4.0000 mg | ORAL_TABLET | ORAL | Status: DC | PRN

## 2017-04-20 MED ORDER — WITCH HAZEL-GLYCERIN EX PADS: 1.0000 "application " | MEDICATED_PAD | CUTANEOUS | Status: DC | PRN

## 2017-04-20 MED ORDER — MISOPROSTOL 25 MCG QUARTER TABLET
25.0000 ug | ORAL_TABLET | ORAL | Status: DC | PRN
  Filled 2017-04-20: qty 1

## 2017-04-20 MED ORDER — ONDANSETRON HCL 4 MG/2ML IJ SOLN
4.0000 mg | INTRAMUSCULAR | Status: DC | PRN
Start: 2017-04-20 — End: 2017-04-22

## 2017-04-20 MED ORDER — SIMETHICONE 80 MG PO CHEW
80.0000 mg | CHEWABLE_TABLET | ORAL | Status: DC | PRN
Start: 2017-04-20 — End: 2017-04-22

## 2017-04-20 MED ORDER — METFORMIN HCL 500 MG PO TABS
500.0000 mg | ORAL_TABLET | Freq: Two times a day (BID) | ORAL | Status: DC
  Administered 2017-04-21 – 2017-04-22 (×3): 500 mg via ORAL
  Filled 2017-04-20 (×5): qty 1

## 2017-04-20 MED ORDER — SENNOSIDES-DOCUSATE SODIUM 8.6-50 MG PO TABS
2.0000 | ORAL_TABLET | ORAL | Status: DC
  Administered 2017-04-21 – 2017-04-22 (×2): 2 via ORAL
  Filled 2017-04-20 (×2): qty 2

## 2017-04-20 MED ORDER — BENZOCAINE-MENTHOL 20-0.5 % EX AERO: 1.0000 "application " | INHALATION_SPRAY | CUTANEOUS | Status: DC | PRN

## 2017-04-20 MED ORDER — PHENYLEPHRINE 40 MCG/ML (10ML) SYRINGE FOR IV PUSH (FOR BLOOD PRESSURE SUPPORT)
80.0000 ug | PREFILLED_SYRINGE | INTRAVENOUS | Status: DC | PRN
  Filled 2017-04-20: qty 5

## 2017-04-20 MED ORDER — IBUPROFEN 600 MG PO TABS
600.0000 mg | ORAL_TABLET | Freq: Four times a day (QID) | ORAL | Status: DC
  Administered 2017-04-20 – 2017-04-22 (×7): 600 mg via ORAL
  Filled 2017-04-20 (×7): qty 1

## 2017-04-20 MED ORDER — LABETALOL HCL 200 MG PO TABS
200.0000 mg | ORAL_TABLET | Freq: Two times a day (BID) | ORAL | Status: DC
  Administered 2017-04-20 – 2017-04-22 (×4): 200 mg via ORAL
  Filled 2017-04-20 (×4): qty 1

## 2017-04-20 MED ORDER — TERBUTALINE SULFATE 1 MG/ML IJ SOLN
0.2500 mg | Freq: Once | INTRAMUSCULAR | Status: DC | PRN
  Filled 2017-04-20: qty 1

## 2017-04-20 MED ORDER — LACTATED RINGERS IV SOLN: 500.0000 mL | Freq: Once | INTRAVENOUS | Status: DC

## 2017-04-20 MED ORDER — SOD CITRATE-CITRIC ACID 500-334 MG/5ML PO SOLN: 30.0000 mL | ORAL | Status: DC | PRN

## 2017-04-20 MED ORDER — EPHEDRINE 5 MG/ML INJ
10.0000 mg | INTRAVENOUS | Status: DC | PRN
  Filled 2017-04-20: qty 2

## 2017-04-20 MED ORDER — ACETAMINOPHEN 325 MG PO TABS
650.0000 mg | ORAL_TABLET | ORAL | Status: DC | PRN
  Administered 2017-04-21 – 2017-04-22 (×4): 650 mg via ORAL
  Filled 2017-04-20 (×4): qty 2

## 2017-04-20 MED ORDER — ONDANSETRON HCL 4 MG/2ML IJ SOLN: 4.0000 mg | Freq: Four times a day (QID) | INTRAMUSCULAR | Status: DC | PRN

## 2017-04-20 MED ORDER — DIPHENHYDRAMINE HCL 50 MG/ML IJ SOLN: 12.5000 mg | INTRAMUSCULAR | Status: DC | PRN

## 2017-04-20 MED ORDER — MEASLES, MUMPS & RUBELLA VAC ~~LOC~~ INJ: 0.5000 mL | INJECTION | Freq: Once | SUBCUTANEOUS | Status: DC

## 2017-04-20 MED ORDER — DEXTROSE IN LACTATED RINGERS 5 % IV SOLN
INTRAVENOUS | Status: DC
  Administered 2017-04-20 (×2): via INTRAVENOUS

## 2017-04-20 MED ORDER — FENTANYL 2.5 MCG/ML BUPIVACAINE 1/10 % EPIDURAL INFUSION (WH - ANES)
14.0000 mL/h | INTRAMUSCULAR | Status: DC | PRN
  Administered 2017-04-20: 14 mL/h via EPIDURAL
  Filled 2017-04-20: qty 100

## 2017-04-20 MED ORDER — FENTANYL CITRATE (PF) 100 MCG/2ML IJ SOLN: 50.0000 ug | INTRAMUSCULAR | Status: DC | PRN

## 2017-04-20 MED ORDER — PRENATAL MULTIVITAMIN CH
1.0000 | ORAL_TABLET | Freq: Every day | ORAL | Status: DC
  Administered 2017-04-21 – 2017-04-22 (×2): 1 via ORAL
  Filled 2017-04-20 (×2): qty 1

## 2017-04-20 MED ORDER — OXYTOCIN BOLUS FROM INFUSION
500.0000 mL | Freq: Once | INTRAVENOUS | Status: AC
  Administered 2017-04-20: 500 mL via INTRAVENOUS

## 2017-04-20 MED ORDER — ACETAMINOPHEN 325 MG PO TABS: 650.0000 mg | ORAL_TABLET | ORAL | Status: DC | PRN

## 2017-04-20 MED ORDER — INSULIN ASPART 100 UNIT/ML ~~LOC~~ SOLN
0.0000 [IU] | Freq: Three times a day (TID) | SUBCUTANEOUS | Status: DC
  Administered 2017-04-22: 2 [IU] via SUBCUTANEOUS
  Filled 2017-04-20: qty 1

## 2017-04-20 MED ORDER — LIDOCAINE HCL (PF) 1 % IJ SOLN
30.0000 mL | INTRAMUSCULAR | Status: DC | PRN
  Filled 2017-04-20: qty 30

## 2017-04-20 MED ORDER — LACTATED RINGERS IV SOLN
INTRAVENOUS | Status: DC
  Administered 2017-04-20 (×2): via INTRAVENOUS

## 2017-04-20 MED ORDER — ZOLPIDEM TARTRATE 5 MG PO TABS: 5.0000 mg | ORAL_TABLET | Freq: Every evening | ORAL | Status: DC | PRN

## 2017-04-20 MED ORDER — DIPHENHYDRAMINE HCL 25 MG PO CAPS: 25.0000 mg | ORAL_CAPSULE | Freq: Four times a day (QID) | ORAL | Status: DC | PRN

## 2017-04-20 MED ORDER — PHENYLEPHRINE 40 MCG/ML (10ML) SYRINGE FOR IV PUSH (FOR BLOOD PRESSURE SUPPORT)
80.0000 ug | PREFILLED_SYRINGE | INTRAVENOUS | Status: DC | PRN
  Filled 2017-04-20: qty 10
  Filled 2017-04-20: qty 5

## 2017-04-20 MED ORDER — INSULIN NPH (HUMAN) (ISOPHANE) 100 UNIT/ML ~~LOC~~ SUSP
5.0000 [IU] | Freq: Every day | SUBCUTANEOUS | Status: DC
  Administered 2017-04-22: 5 [IU] via SUBCUTANEOUS

## 2017-04-20 MED ORDER — LIDOCAINE HCL (PF) 1 % IJ SOLN
INTRAMUSCULAR | Status: DC | PRN
  Administered 2017-04-20: 4 mL via EPIDURAL

## 2017-04-20 MED ORDER — LACTATED RINGERS IV SOLN: 500.0000 mL | INTRAVENOUS | Status: DC | PRN

## 2017-04-20 MED ORDER — FLEET ENEMA 7-19 GM/118ML RE ENEM: 1.0000 | ENEMA | RECTAL | Status: DC | PRN

## 2017-04-20 MED ORDER — INSULIN NPH (HUMAN) (ISOPHANE) 100 UNIT/ML ~~LOC~~ SUSP
25.0000 [IU] | Freq: Every day | SUBCUTANEOUS | Status: DC
  Administered 2017-04-20: 25 [IU] via SUBCUTANEOUS
  Filled 2017-04-20: qty 10

## 2017-04-20 MED ORDER — COCONUT OIL OIL: 1.0000 "application " | TOPICAL_OIL | Status: DC | PRN

## 2017-04-20 MED ORDER — OXYTOCIN 40 UNITS IN LACTATED RINGERS INFUSION - SIMPLE MED
1.0000 m[IU]/min | INTRAVENOUS | Status: DC
  Administered 2017-04-20: 2 m[IU]/min via INTRAVENOUS
  Filled 2017-04-20: qty 1000

## 2017-04-20 NOTE — Anesthesia Pain Management Evaluation Note (Signed)
  CRNA Pain Management Visit Note  Patient: [name], 42 y.o., female  "Hello I am a member of the anesthesia team at [facility]. We have an anesthesia team available at all times to provide care throughout the hospital, including epidural management and anesthesia for C-section. I don't know your plan for the delivery whether it a natural birth, water birth, IV sedation, nitrous supplementation, doula or epidural, but we want to meet your pain goals."   1.Was your pain managed to your expectations on prior hospitalizations?   Yes   2.What is your expectation for pain management during this hospitalization?     Epidural  3.How can we help you reach that goal? unsure  Record the patient's initial score and the patient's pain goal.   Pain: 3  Pain Goal: 8 [facility] wants you to be able to say your pain was always managed very well.  Cephus ShellingBURGER,Allina Riches 04/20/2017

## 2017-04-20 NOTE — Anesthesia Preprocedure Evaluation (Signed)
Anesthesia Evaluation  Patient identified by MRN, date of birth, ID band Patient awake    Reviewed: Allergy & Precautions, Patient's Chart, lab work & pertinent test results  Airway Mallampati: III       Dental  (+) Teeth Intact   Pulmonary    Pulmonary exam normal        Cardiovascular hypertension, Normal cardiovascular exam     Neuro/Psych negative neurological ROS  negative psych ROS   GI/Hepatic negative GI ROS, Neg liver ROS,   Endo/Other  diabetes, Type 2, Insulin Dependent, Oral Hypoglycemic Agents  Renal/GU      Musculoskeletal negative musculoskeletal ROS (+)   Abdominal   Peds  Hematology   Anesthesia Other Findings   Reproductive/Obstetrics (+) Pregnancy                             Anesthesia Physical Anesthesia Plan  ASA: III  Anesthesia Plan: Epidural   Post-op Pain Management:    Induction:   PONV Risk Score and Plan:   Airway Management Planned:   Additional Equipment:   Intra-op Plan:   Post-operative Plan:   Informed Consent: I have reviewed the patients History and Physical, chart, labs and discussed the procedure including the risks, benefits and alternatives for the proposed anesthesia with the patient or authorized representative who has indicated his/her understanding and acceptance.     Plan Discussed with:   Anesthesia Plan Comments: (Lab Results      Component                Value               Date                      WBC                      11.1 (H)            04/20/2017                HGB                      11.2 (L)            04/20/2017                HCT                      33.4 (L)            04/20/2017                MCV                      85.2                04/20/2017                PLT                      188                 04/20/2017           )        Anesthesia Quick Evaluation

## 2017-04-20 NOTE — H&P (Signed)
[name] is Davila 42 y.o. female presenting for induction pt with both CHTN and type 2 DM. Both are well controlled OB History    Gravida Para Term Preterm AB Living   6 5 5     5    SAB TAB Ectopic Multiple Live Births           5     Past Medical History:  Diagnosis Date  . Abnormal Pap smear 2007   LGSIL/CIN1  . Complication of anesthesia    Patient says she's sick after waking up  . Diabetes mellitus    insulin now, metformin when not pregnant  . Dysplasia of cervix, low grade (CIN 1) 10/21/05  . H/O varicella   . High BMI   . Macrosomia    H/O  . Pregnancy induced hypertension   . Vaginal Pap smear, abnormal    Past Surgical History:  Procedure Laterality Date  . CARPAL TUNNEL RELEASE  1998   x 2  . CESAREAN SECTION  2004  . TONSILLECTOMY AND ADENOIDECTOMY     age 42   Family History: family history includes Cancer in her maternal grandfather and mother; Diabetes in her father and paternal grandfather; Heart disease in her father and paternal grandfather; Hypertension in her paternal grandmother. Social History:  reports that  has never smoked. she has never used smokeless tobacco. She reports that she does not drink alcohol or use drugs.     Maternal Diabetes: Yes:  Diabetes Type:  Insulin/Medication controlled Genetic Screening: Declined Maternal Ultrasounds/Referrals: Normal Fetal Ultrasounds or other Referrals:  None Maternal Substance Abuse:  No Significant Maternal Medications:  Meds include: Other: labetalol and  Significant Maternal Lab Results:  None Other Comments:  None  ROS History Dilation: 3.5 Effacement (%): 50 Station: -1 Exam by:: Dr. Normand Sloopillard Blood pressure (!) 153/82, pulse 75, temperature 98.6 F (37 C), temperature source Oral, resp. rate 18, height 5' 6.5" (1.689 m), weight 131.5 kg (290 lb), unknown if currently breastfeeding. Exam Physical Exam  Physical Examination: General appearance - alert, well appearing, and in no  distress Chest - clear to auscultation, no wheezes, rales or rhonchi, symmetric air entry Heart - normal rate and regular rhythm Abdomen - soft, nontender, nondistended, no masses or organomegaly gravid Extremities - Homan's sign negative bilaterally Prenatal labs: ABO, Rh: --/--/Davila POS (01/03 0840) Antibody: NEG (01/03 0840) Rubella: Immune (06/13 0000) RPR: Non Reactive (01/03 0840)  HBsAg: Negative (06/13 0000)  HIV: Non-reactive (06/13 0000)  GBS: Negative (12/14 0000)   Assessment/Plan: 39 weeks CHTN and type 2 DM Pt started on pitocin and foley placed AROM with clear fluid GBS neg Epidural prn Anticipate VBAC   [name] 04/20/2017, 2:37 PM

## 2017-04-20 NOTE — Anesthesia Procedure Notes (Signed)
Epidural Patient location during procedure: OB Start time: 04/20/2017 2:53 PM End time: 04/20/2017 3:03 PM  Staffing Anesthesiologist: Shelton SilvasHollis, Damaria Vachon D, MD Performed: anesthesiologist   Preanesthetic Checklist Completed: patient identified, site marked, surgical consent, pre-op evaluation, timeout performed, IV checked, risks and benefits discussed and monitors and equipment checked  Epidural Patient position: sitting Prep: ChloraPrep Patient monitoring: heart rate, continuous pulse ox and blood pressure Approach: midline Location: L3-L4 Injection technique: LOR saline  Needle:  Needle type: Tuohy  Needle gauge: 17 G Needle length: 9 cm Catheter type: closed end flexible Catheter size: 20 Guage Test dose: negative and 1.5% lidocaine  Assessment Events: blood not aspirated, injection not painful, no injection resistance and no paresthesia  Additional Notes LOR @ 8  Patient identified. Risks/Benefits/Options discussed with patient including but not limited to bleeding, infection, nerve damage, paralysis, failed block, incomplete pain control, headache, blood pressure changes, nausea, vomiting, reactions to medications, itching and postpartum back pain. Confirmed with bedside nurse the patient's most recent platelet count. Confirmed with patient that they are not currently taking any anticoagulation, have any bleeding history or any family history of bleeding disorders. Patient expressed understanding and wished to proceed. All questions were answered. Sterile technique was used throughout the entire procedure. Please see nursing notes for vital signs. Test dose was given through epidural catheter and negative prior to continuing to dose epidural or start infusion. Warning signs of high block given to the patient including shortness of breath, tingling/numbness in hands, complete motor block, or any concerning symptoms with instructions to call for help. Patient was given instructions on  fall risk and not to get out of bed. All questions and concerns addressed with instructions to call with any issues or inadequate analgesia.    Reason for block:procedure for pain

## 2017-04-21 LAB — CBC
HCT: 32.5 % — ABNORMAL LOW (ref 36.0–46.0)
Hemoglobin: 10.7 g/dL — ABNORMAL LOW (ref 12.0–15.0)
MCH: 28.3 pg (ref 26.0–34.0)
MCHC: 32.9 g/dL (ref 30.0–36.0)
MCV: 86 fL (ref 78.0–100.0)
Platelets: 177 K/uL (ref 150–400)
RBC: 3.78 MIL/uL — ABNORMAL LOW (ref 3.87–5.11)
RDW: 15.3 % (ref 11.5–15.5)
WBC: 10.7 K/uL — ABNORMAL HIGH (ref 4.0–10.5)

## 2017-04-21 LAB — GLUCOSE, CAPILLARY
Glucose-Capillary: 57 mg/dL — ABNORMAL LOW (ref 65–99)
Glucose-Capillary: 77 mg/dL (ref 65–99)
Glucose-Capillary: 61 mg/dL — ABNORMAL LOW (ref 65–99)
Glucose-Capillary: 90 mg/dL (ref 65–99)
Glucose-Capillary: 140 mg/dL — ABNORMAL HIGH (ref 65–99)
Glucose-Capillary: 89 mg/dL (ref 65–99)
Glucose-Capillary: 167 mg/dL — ABNORMAL HIGH (ref 65–99)

## 2017-04-21 MED ORDER — INSULIN NPH (HUMAN) (ISOPHANE) 100 UNIT/ML ~~LOC~~ SUSP
10.0000 [IU] | Freq: Every day | SUBCUTANEOUS | Status: DC
  Administered 2017-04-21: 10 [IU] via SUBCUTANEOUS

## 2017-04-21 NOTE — Lactation Note (Signed)
This note was copied from a baby's chart. Lactation Consultation Note  Patient Name: [name] Today's Date: 04/21/2017   P6,  Baby 26 hours old.  Ex BF 1-2 years with all but first child.   She exclusively pumped w/ first child. Baby latched upon entering in cradle hold. Intermittent sucks and swallows observed. Mother stated that she knows how to hand express and has used a pump before. Encouraged breast compression to keep baby active. Mom made aware of O/P services, breastfeeding support groups, community resources, and our phone # for post-discharge questions.  No questions or concerns.     Maternal Data    Feeding Feeding Type: Breast Fed Length of feed: 5 min  LATCH Score                   Interventions    Lactation Tools Discussed/Used     Consult Status      [name], [name] 04/21/2017, 8:02 PM

## 2017-04-21 NOTE — Anesthesia Postprocedure Evaluation (Signed)
Anesthesia Post Note  Patient: Lawyerachel Lovelace-Baumgardner  Procedure(s) Performed: AN AD HOC LABOR EPIDURAL     Patient location during evaluation: Mother Baby Anesthesia Type: Epidural Level of consciousness: awake Pain management: pain level controlled Vital Signs Assessment: post-procedure vital signs reviewed and stable Respiratory status: spontaneous breathing Cardiovascular status: stable Postop Assessment: patient able to bend at knees and epidural receding Anesthetic complications: no    Last Vitals:  Vitals:   04/21/17 0530 04/21/17 0822  BP: 137/71 (!) 142/73  Pulse: 76 77  Resp: 18 18  Temp: 36.8 C 36.9 C  SpO2: 99%     Last Pain:  Vitals:   04/21/17 0822  TempSrc: Axillary  PainSc:    Pain Goal:                 Edison PaceWILKERSON,Kristine Chahal

## 2017-04-21 NOTE — Progress Notes (Signed)
Reason for Admission: induction of labor AMA, Chronic HTN, Type 2 Diabetes Prenatal Procedures:  Intrapartum Procedures: spontaneous vaginal delivery Postpartum Procedures: none Complications-Operative and Postpartum: 1 degree perineal laceration Hemoglobin  Date Value Ref Range Status  04/21/2017 10.7 (L) 12.0 - 15.0 g/dL Final   HCT  Date Value Ref Range Status  04/21/2017 32.5 (L) 36.0 - 46.0 % Final   BP (!) 160/82 (BP Location: Right Arm)   Pulse 85   Temp 97.8 F (36.6 C) (Oral)   Resp 16   Ht 5' 6.5" (1.689 m)   Wt 290 lb (131.5 kg)   SpO2 99%   Breastfeeding? Unknown   BMI 46.11 kg/m   Physical Exam:  General: alert, cooperative and appears stated age 64Lochia: appropriate Uterine Fundus: firm Incision:  DVT Evaluation: No evidence of DVT seen on physical exam.  Discharge Diagnoses: Term Pregnancy-delivered Pt requests 24 hour discharge Discharge Information: Date: 04/21/2017 Activity: pelvic rest Diet: diabetic Medications: PNV and Ibuprofen  Medication Change per consult with Dr. Idamae SchullerVarnardo: After reviewing Dr C. White records, pt was found to have been placed on Metformin 1000mg  BID and Labetolol 400mg  BID. Reviewed this with pt and pt verbalizes understanding of medication change. Condition: stable Instructions: refer to practice specific booklet Discharge to: home   Newborn Data: Live born female  Birth Weight: 7 lb 7.9 oz (3399 g) APGAR: 9, 9  Newborn Delivery   Birth date/time:  04/20/2017 17:40:00 Delivery type:  VBAC, Spontaneous     Home with mother. Baby was not discharged . Mother stayed  Rhea PinkLori A Clemmons CNM

## 2017-04-21 NOTE — Progress Notes (Signed)
90 CBG is 2hr after breakfast and patient is ordering lunch now , so only one CBG done

## 2017-04-21 NOTE — Progress Notes (Addendum)
Inpatient Diabetes Program Recommendations  AACE/ADA: New Consensus Statement on Inpatient Glycemic Control (2015)  Target Ranges:  Prepandial:   less than 140 mg/dL      Peak postprandial:   less than 180 mg/dL (1-2 hours)      Critically ill patients:  140 - 180 mg/dL   Results for Girard CooterLOVELACE-BAUMGARDNER, Joyous (MRN [id number]) as of 04/21/2017 08:03  Ref. Range 04/20/2017 11:33 04/20/2017 12:31 04/20/2017 13:41 04/20/2017 14:28 04/20/2017 15:29 04/20/2017 17:23 04/20/2017 17:58 04/20/2017 22:47 04/21/2017 05:07 04/21/2017 05:40 04/21/2017 07:46  Glucose-Capillary Latest Ref Range: 65 - 99 mg/dL 62 (L) 595101 (H) 80 89 638130 (H) 50 (L) 82 87 57 (L) 77 61 (L)   Review of Glycemic Control  Diabetes history: DM2 Outpatient Diabetes medications: NPH 10 units QAM, NPH 50 units QHS, Metfomrin 500 mg BID, Novolog 10 units once Current orders for Inpatient glycemic control: NPH 5 units QAM, NPH 25 units QHS, Novolog 0-15 units TID with meals, Metformin 500 mg BID  Inpatient Diabetes Program Recommendations:  Insulin - Basal: Per chart, patient was taking only Metformin for DM control prior to pregnancy. CBGs are trending low and noted to be 57 mg/dl this morning. Patient received NPH 25 units at 22:48 on 04/20/17. Anticipate patient will not need any basal insulin at this time. Please discontinue NPH at this time (both NPH 5 units QAM and 25 units QHS).  Insulin-Correction: Please consider ordering Novolog 0-5 units QHS for bedtime correction. Outpatient Follow Up: Recommend patient follow up with PCP regarding DM control.  Thanks, Orlando PennerMarie Nijah Orlich, RN, MSN, CDE Diabetes Coordinator Inpatient Diabetes Program [phone] (Team Pager from 8am to 5pm)

## 2017-04-22 LAB — GLUCOSE, CAPILLARY
GLUCOSE-CAPILLARY: 127 mg/dL — AB (ref 65–99)
Glucose-Capillary: 109 mg/dL — ABNORMAL HIGH (ref 65–99)

## 2017-04-22 MED ORDER — LABETALOL HCL 200 MG PO TABS: 400.0000 mg | ORAL_TABLET | Freq: Two times a day (BID) | ORAL | 0 refills | Status: AC

## 2017-04-22 MED ORDER — METFORMIN HCL 500 MG PO TABS: 1000.0000 mg | ORAL_TABLET | Freq: Two times a day (BID) | ORAL | 2 refills | Status: AC

## 2017-04-22 NOTE — Discharge Summary (Signed)
Obstetric Discharge Summary S: Pt very adamant about being discharged today. States she is not worried about her blood pressure reading and she has 5 children at home to take care of. She states that she has an appointment for the baby with Dr. Esmond Harps. [place] and will have her blood pressure taken at the office on Monday Reason for Admission: induction of labor AMA, Chronic HTN, Type 2 Diabetes Prenatal Procedures:  Intrapartum Procedures: spontaneous vaginal delivery Postpartum Procedures: none Complications-Operative and Postpartum: 1 degree perineal laceration Hemoglobin  Date Value Ref Range Status  04/21/2017 10.7 (L) 12.0 - 15.0 g/dL Final   HCT  Date Value Ref Range Status  04/21/2017 32.5 (L) 36.0 - 46.0 % Final    Physical Exam:  General: alert, cooperative and appears stated age 3Lochia: appropriate Uterine Fundus: firm Incision:  DVT Evaluation: No evidence of DVT seen on physical exam.  Discharge Diagnoses: Term Pregnancy-delivered Pt requests 24 hour discharge Discharge Information: Date: 04/22/2017 Activity: pelvic rest Diet: diabetic Medications: PNV and Ibuprofen  Medication Change per consult with Dr. Idamae SchullerVarnardo: After reviewing Dr C. White records, pt was found to have been placed on Metformin 1000mg  BID and Labetolol 400mg  BID. Reviewed this with pt and pt verbalizes understanding of medication change. Condition: stable Instructions: refer to practice specific booklet Discharge to: home Follow-up Information    Dillard, Naima, MD Follow up in 3 day(s).   Specialty:  Obstetrics and Gynecology Why:  Come in early next week for a Blood Pressure check Then make an appointment for your 6 week post-partum visit Contact information: [address] [place] [address] [phone]            Allergies as of 04/22/2017   No Known Allergies     Medication List    TAKE these medications   insulin aspart 100 UNIT/ML injection Commonly known as:   novoLOG Inject 10 Units into the skin once.   insulin NPH Human 100 UNIT/ML injection Commonly known as:  HUMULIN N,NOVOLIN N Inject 10-50 Units into the skin 2 (two) times daily before a meal. Patient takes 10 units in the morning and 50 units at night   labetalol 200 MG tablet Commonly known as:  NORMODYNE Take 2 tablets (400 mg total) by mouth 2 (two) times daily. What changed:  how much to take   metFORMIN 500 MG tablet Commonly known as:  GLUCOPHAGE Take 2 tablets (1,000 mg total) by mouth 2 (two) times daily with a meal. What changed:  how much to take      Newborn Data: Live born female  Birth Weight: 7 lb 7.9 oz (3399 g) APGAR: 9, 9  Newborn Delivery   Birth date/time:  04/20/2017 17:40:00 Delivery type:  VBAC, Spontaneous     Home with mother.   Elmore GuiseLori A Luisangel Wainright CNM

## 2017-04-22 NOTE — Lactation Note (Signed)
This note was copied from a baby's chart. Lactation Consultation Note  Patient Name: [name] JYNWG'NToday's Date: 04/22/2017 Reason for consult: Follow-up assessment;Term;Infant weight loss  Baby is 1842 hours old  LC reviewed and update the doc flow sheets  Baby awake and hungry and mom latched with depth , multiple swallows noted  Per mom comfortable.  LC discussed nutritive vs non - nutritive feeding patterns, an dto watch for hanging out latched.  Sore  Nipple and engorgement prevention and tx reviewed.  Mom had hand pump for D./C Mother informed of post-discharge support and given phone number to the lactation department, including services for phone call assistance; out-patient appointments; and breastfeeding support group. List of other breastfeeding resources in the community given in the handout. Encouraged mother to call for problems or concerns related to breastfeeding.    Maternal Data Has patient been taught Hand Expression?: Yes  Feeding Feeding Type: Breast Fed Length of feed: (multiple swallows )  LATCH Score Latch: Grasps breast easily, tongue down, lips flanged, rhythmical sucking.  Audible Swallowing: Spontaneous and intermittent  Type of Nipple: Everted at rest and after stimulation  Comfort (Breast/Nipple): Soft / non-tender  Hold (Positioning): No assistance needed to correctly position infant at breast.  LATCH Score: 10  Interventions Interventions: Breast feeding basics reviewed;Assisted with latch;Skin to skin;Breast massage;Hand express  Lactation Tools Discussed/Used Tools: Pump Breast pump type: Manual Pump Review: Milk Storage   Consult Status Consult Status: Complete Date: 04/22/17    [name] 04/22/2017, 12:27 PM

## 2017-04-27 ENCOUNTER — Other Ambulatory Visit: Payer: Self-pay | Admitting: Obstetrics and Gynecology

## 2017-04-27 DIAGNOSIS — R1084 Generalized abdominal pain: Secondary | ICD-10-CM

## 2017-04-27 DIAGNOSIS — R1010 Upper abdominal pain, unspecified: Secondary | ICD-10-CM | POA: Diagnosis not present

## 2017-04-27 DIAGNOSIS — R10819 Abdominal tenderness, unspecified site: Secondary | ICD-10-CM

## 2017-05-01 ENCOUNTER — Ambulatory Visit
Admission: RE | Admit: 2017-05-01 | Discharge: 2017-05-01 | Disposition: A | Discharge status: Home / Self Care | Payer: BLUE CROSS/BLUE SHIELD | Source: Ambulatory Visit | Attending: Obstetrics and Gynecology | Admitting: Obstetrics and Gynecology

## 2017-05-01 DIAGNOSIS — R1084 Generalized abdominal pain: Secondary | ICD-10-CM

## 2017-05-01 DIAGNOSIS — R10819 Abdominal tenderness, unspecified site: Secondary | ICD-10-CM

## 2017-05-01 DIAGNOSIS — K8020 Calculus of gallbladder without cholecystitis without obstruction: Secondary | ICD-10-CM | POA: Diagnosis not present

## 2018-01-31 DIAGNOSIS — Z23 Encounter for immunization: Secondary | ICD-10-CM | POA: Diagnosis not present

## 2018-03-26 DIAGNOSIS — N181 Chronic kidney disease, stage 1: Secondary | ICD-10-CM | POA: Diagnosis not present

## 2018-03-26 DIAGNOSIS — E1122 Type 2 diabetes mellitus with diabetic chronic kidney disease: Secondary | ICD-10-CM | POA: Diagnosis not present

## 2018-03-26 DIAGNOSIS — I129 Hypertensive chronic kidney disease with stage 1 through stage 4 chronic kidney disease, or unspecified chronic kidney disease: Secondary | ICD-10-CM | POA: Diagnosis not present

## 2018-03-26 DIAGNOSIS — E785 Hyperlipidemia, unspecified: Secondary | ICD-10-CM | POA: Diagnosis not present

## 2018-06-24 IMAGING — US US ABDOMEN LIMITED
1 series · 14 of 25 positions shown · non-contrast
Comparison: None.

CLINICAL DATA: 41-year-old female with upper abdominal pain for 3
days. Initial encounter.

EXAM:
ULTRASOUND ABDOMEN LIMITED RIGHT UPPER QUADRANT

[Series 1: us abdomen limited · 0.25mm/px · 14 of 53 slices shown]
[im 1/53]
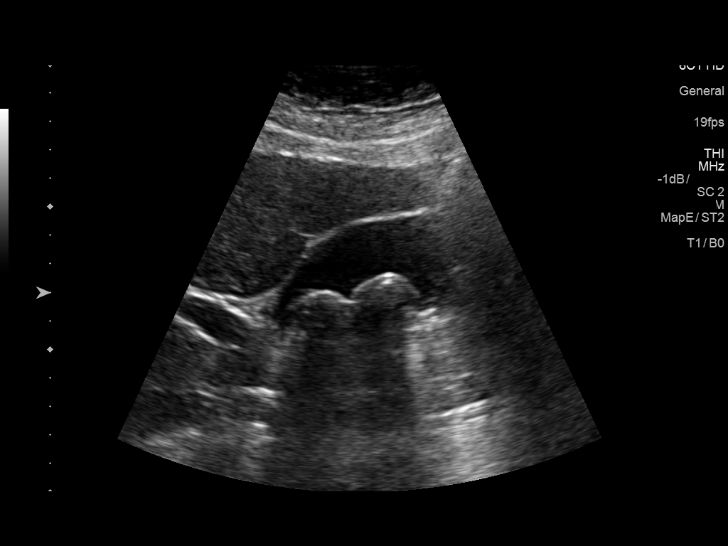
[im 5/53]
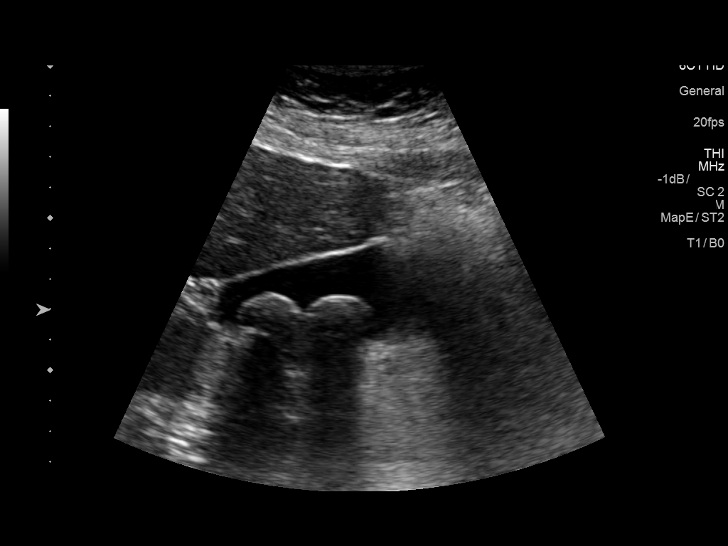
[im 9/53]
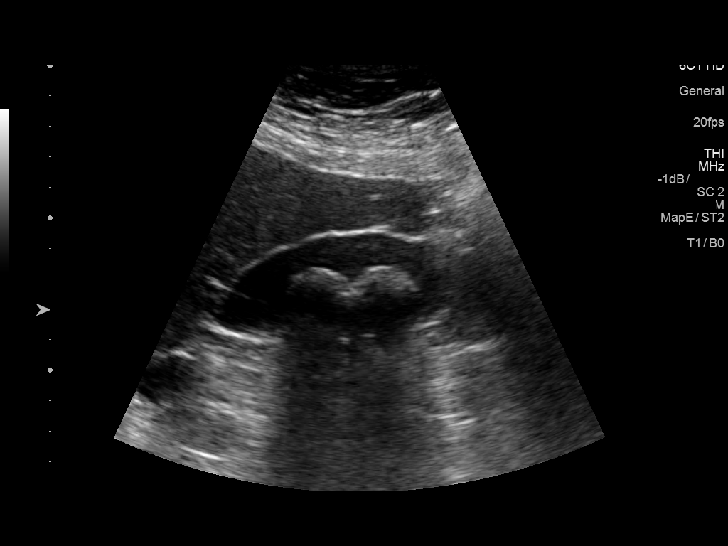
[im 14/53]
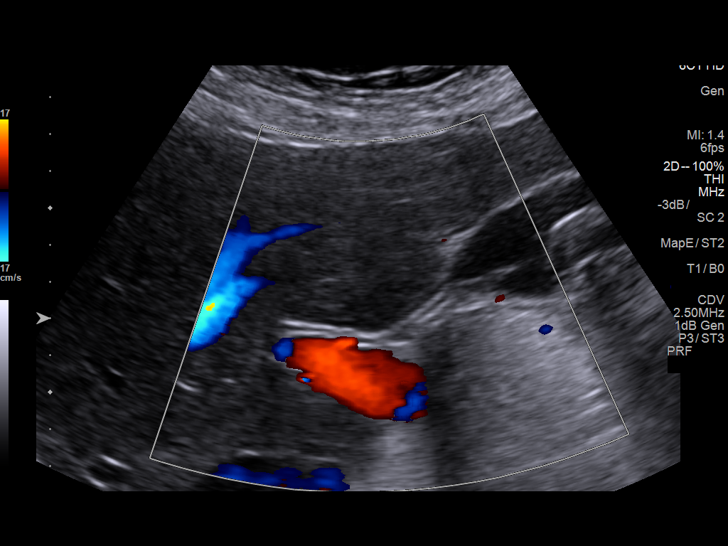
[im 18/53]
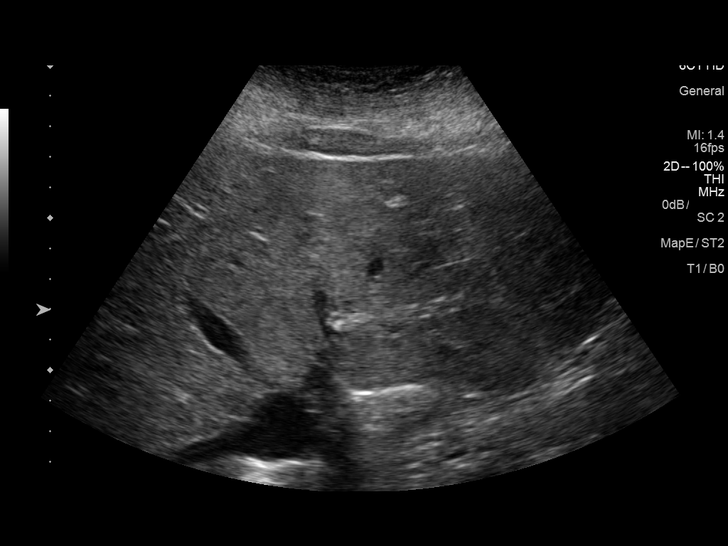
[im 20/53]
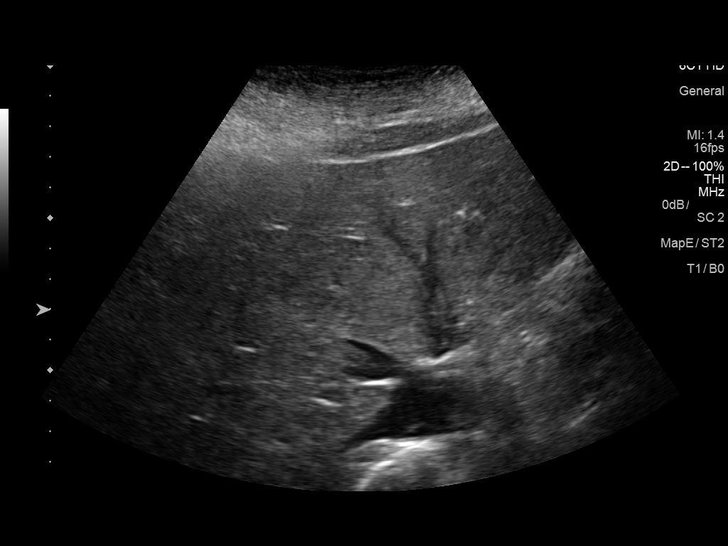
[im 24/53]
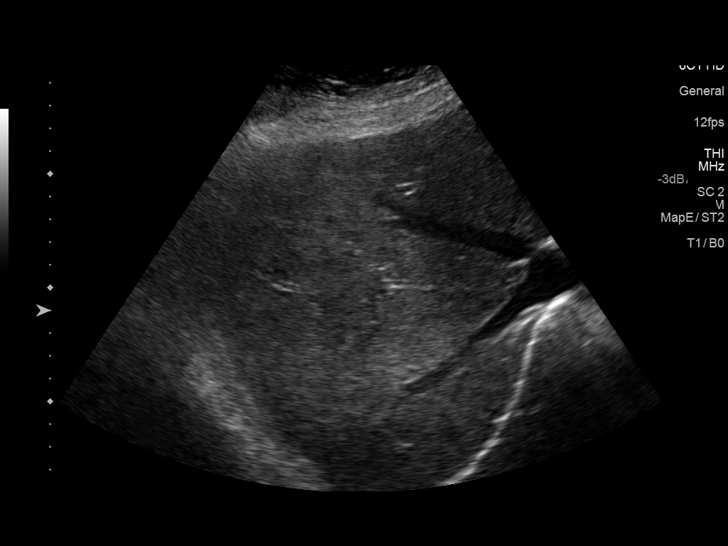
[im 29/53]
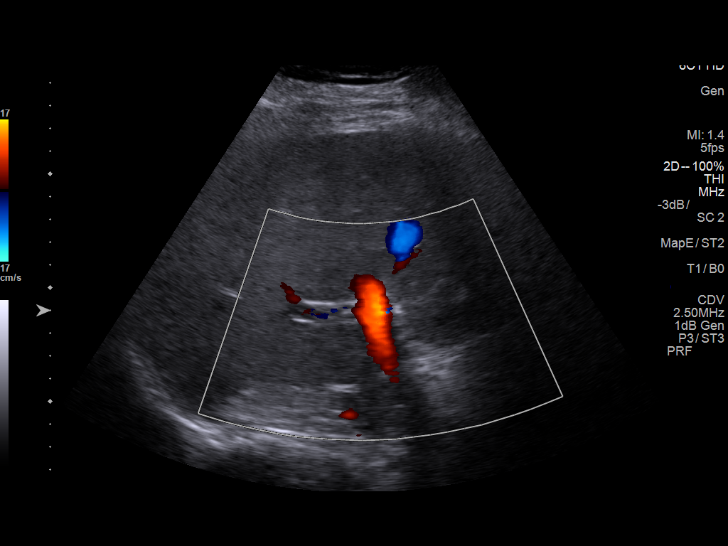
[im 33/53]
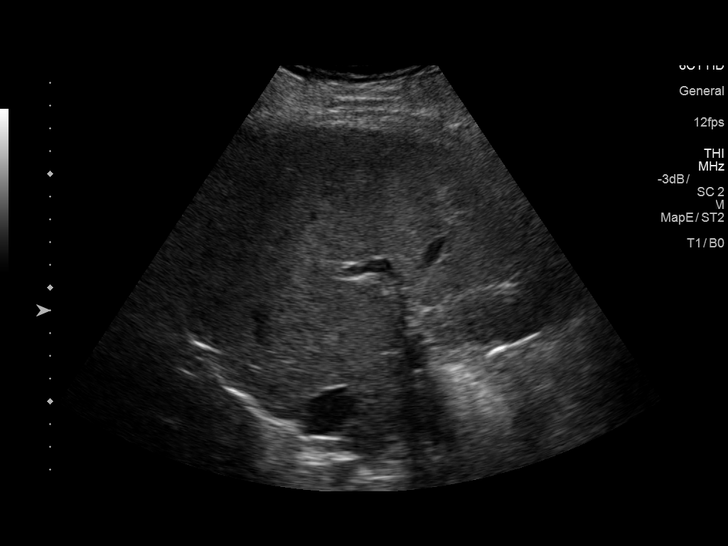
[im 35/53]
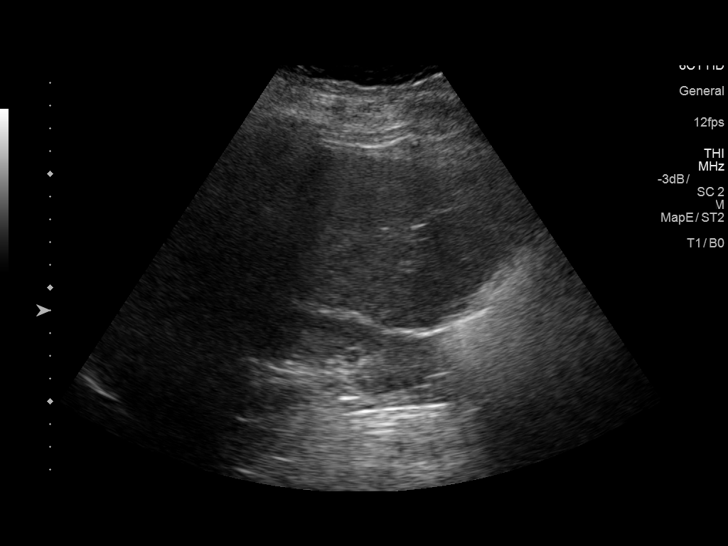
[im 40/53]
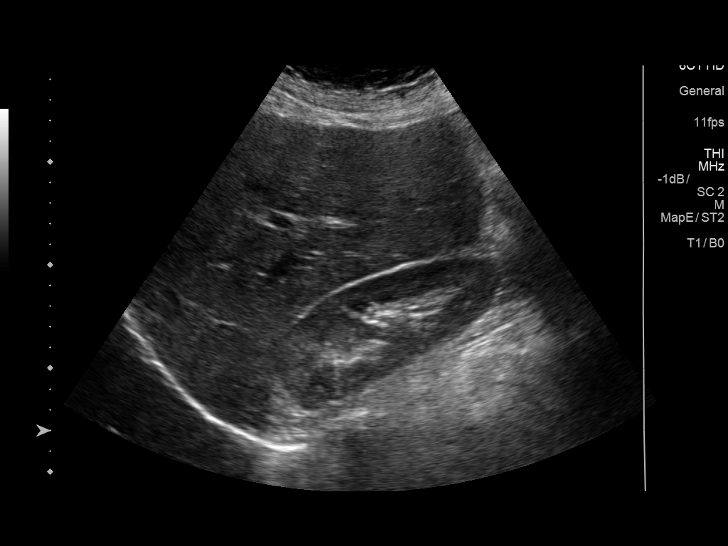
[im 44/53]
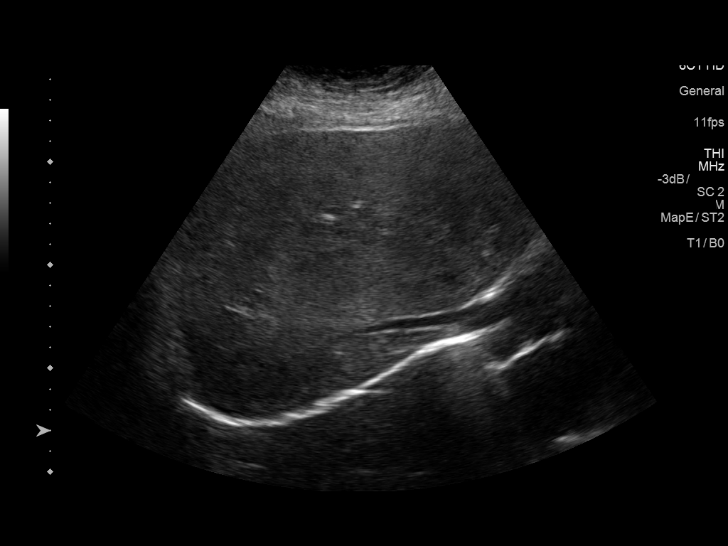
[im 48/53]
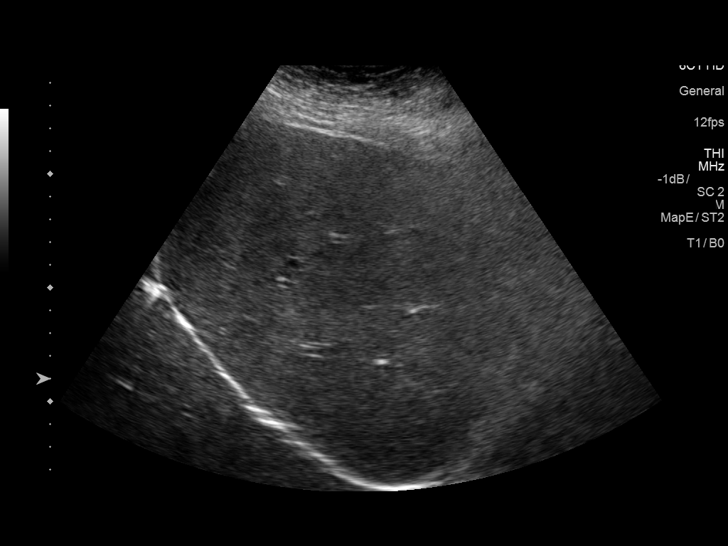
[im 53/53]
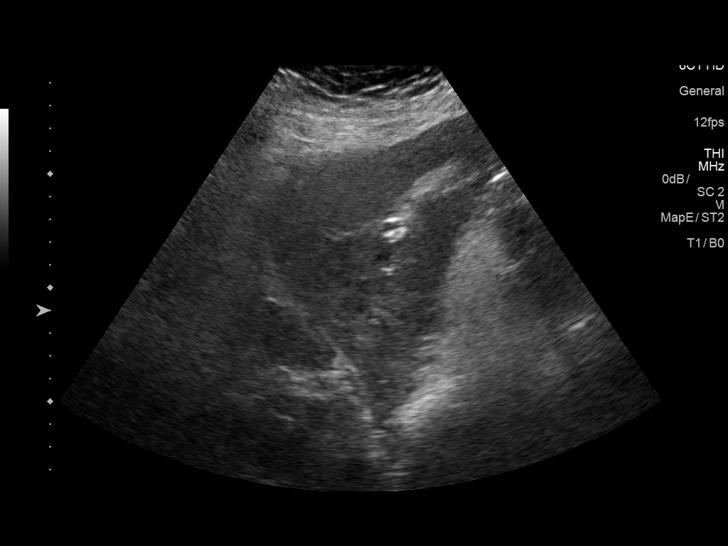

[14 of 25 positions shown; findings below may reference images not displayed]

FINDINGS: Gallbladder:

Two mobile gallstones measuring up to 2.4 cm. No gallbladder wall
thickening or pericholecystic fluid. Patient was not tender over
this region during scanning per ultrasound technologist.

Common bile duct:

Diameter: 2.5 mm

Liver:

No focal lesion identified. Within normal limits in parenchymal
echogenicity. Portal vein is patent on color Doppler imaging with
normal direction of blood flow towards the liver.
IMPRESSION: Gallstones without evidence of gallbladder inflammation.

Otherwise negative right upper quadrant ultrasound.

## 2019-01-03 DIAGNOSIS — Z23 Encounter for immunization: Secondary | ICD-10-CM | POA: Diagnosis not present

## 2019-01-08 DIAGNOSIS — E785 Hyperlipidemia, unspecified: Secondary | ICD-10-CM | POA: Diagnosis not present

## 2019-01-08 DIAGNOSIS — I10 Essential (primary) hypertension: Secondary | ICD-10-CM | POA: Diagnosis not present

## 2019-01-08 DIAGNOSIS — E1169 Type 2 diabetes mellitus with other specified complication: Secondary | ICD-10-CM | POA: Diagnosis not present

## 2019-01-10 DIAGNOSIS — Z01419 Encounter for gynecological examination (general) (routine) without abnormal findings: Secondary | ICD-10-CM | POA: Diagnosis not present

## 2019-01-10 DIAGNOSIS — Z124 Encounter for screening for malignant neoplasm of cervix: Secondary | ICD-10-CM | POA: Diagnosis not present

## 2019-01-10 DIAGNOSIS — Z6841 Body Mass Index (BMI) 40.0 and over, adult: Secondary | ICD-10-CM | POA: Diagnosis not present

## 2019-02-27 DIAGNOSIS — E1169 Type 2 diabetes mellitus with other specified complication: Secondary | ICD-10-CM | POA: Diagnosis not present

## 2019-02-27 DIAGNOSIS — E785 Hyperlipidemia, unspecified: Secondary | ICD-10-CM | POA: Diagnosis not present

## 2019-09-26 DIAGNOSIS — E1122 Type 2 diabetes mellitus with diabetic chronic kidney disease: Secondary | ICD-10-CM | POA: Diagnosis not present

## 2019-09-26 DIAGNOSIS — I129 Hypertensive chronic kidney disease with stage 1 through stage 4 chronic kidney disease, or unspecified chronic kidney disease: Secondary | ICD-10-CM | POA: Diagnosis not present

## 2019-09-26 DIAGNOSIS — N181 Chronic kidney disease, stage 1: Secondary | ICD-10-CM | POA: Diagnosis not present

## 2019-09-26 DIAGNOSIS — E785 Hyperlipidemia, unspecified: Secondary | ICD-10-CM | POA: Diagnosis not present

## 2019-11-19 DIAGNOSIS — E119 Type 2 diabetes mellitus without complications: Secondary | ICD-10-CM | POA: Diagnosis not present

## 2020-01-13 DIAGNOSIS — Z01419 Encounter for gynecological examination (general) (routine) without abnormal findings: Secondary | ICD-10-CM | POA: Diagnosis not present

## 2020-01-16 DIAGNOSIS — E785 Hyperlipidemia, unspecified: Secondary | ICD-10-CM | POA: Diagnosis not present

## 2020-01-16 DIAGNOSIS — Z23 Encounter for immunization: Secondary | ICD-10-CM | POA: Diagnosis not present

## 2020-01-16 DIAGNOSIS — I10 Essential (primary) hypertension: Secondary | ICD-10-CM | POA: Diagnosis not present

## 2020-01-16 DIAGNOSIS — E1169 Type 2 diabetes mellitus with other specified complication: Secondary | ICD-10-CM | POA: Diagnosis not present

## 2020-03-24 DIAGNOSIS — Z1231 Encounter for screening mammogram for malignant neoplasm of breast: Secondary | ICD-10-CM | POA: Diagnosis not present

## 2020-05-18 DIAGNOSIS — E1169 Type 2 diabetes mellitus with other specified complication: Secondary | ICD-10-CM | POA: Diagnosis not present

## 2020-05-18 DIAGNOSIS — E785 Hyperlipidemia, unspecified: Secondary | ICD-10-CM | POA: Diagnosis not present

## 2020-05-18 DIAGNOSIS — I10 Essential (primary) hypertension: Secondary | ICD-10-CM | POA: Diagnosis not present

## 2020-05-20 DIAGNOSIS — N898 Other specified noninflammatory disorders of vagina: Secondary | ICD-10-CM | POA: Diagnosis not present

## 2020-05-20 DIAGNOSIS — N926 Irregular menstruation, unspecified: Secondary | ICD-10-CM | POA: Diagnosis not present

## 2020-06-10 DIAGNOSIS — E119 Type 2 diabetes mellitus without complications: Secondary | ICD-10-CM | POA: Diagnosis not present

## 2020-06-10 DIAGNOSIS — N926 Irregular menstruation, unspecified: Secondary | ICD-10-CM | POA: Diagnosis not present

## 2021-08-17 ENCOUNTER — Other Ambulatory Visit: Payer: Self-pay | Admitting: Family Medicine

## 2021-08-17 ENCOUNTER — Ambulatory Visit
Admission: RE | Admit: 2021-08-17 | Discharge: 2021-08-17 | Disposition: A | Discharge status: Home / Self Care | Payer: Self-pay | Source: Ambulatory Visit | Attending: Family Medicine | Admitting: Family Medicine

## 2021-08-17 DIAGNOSIS — M546 Pain in thoracic spine: Secondary | ICD-10-CM

## 2023-01-31 DIAGNOSIS — E785 Hyperlipidemia, unspecified: Secondary | ICD-10-CM | POA: Diagnosis not present

## 2023-01-31 DIAGNOSIS — E1169 Type 2 diabetes mellitus with other specified complication: Secondary | ICD-10-CM | POA: Diagnosis not present

## 2023-01-31 DIAGNOSIS — I499 Cardiac arrhythmia, unspecified: Secondary | ICD-10-CM | POA: Diagnosis not present

## 2023-01-31 DIAGNOSIS — E669 Obesity, unspecified: Secondary | ICD-10-CM | POA: Diagnosis not present

## 2023-01-31 DIAGNOSIS — I10 Essential (primary) hypertension: Secondary | ICD-10-CM | POA: Diagnosis not present

## 2023-03-01 DIAGNOSIS — Z6833 Body mass index (BMI) 33.0-33.9, adult: Secondary | ICD-10-CM | POA: Diagnosis not present

## 2023-03-01 DIAGNOSIS — Z1231 Encounter for screening mammogram for malignant neoplasm of breast: Secondary | ICD-10-CM | POA: Diagnosis not present

## 2023-03-01 DIAGNOSIS — Z01411 Encounter for gynecological examination (general) (routine) with abnormal findings: Secondary | ICD-10-CM | POA: Diagnosis not present

## 2023-03-01 DIAGNOSIS — J069 Acute upper respiratory infection, unspecified: Secondary | ICD-10-CM | POA: Diagnosis not present

## 2023-03-01 DIAGNOSIS — Z01419 Encounter for gynecological examination (general) (routine) without abnormal findings: Secondary | ICD-10-CM | POA: Diagnosis not present

## 2023-05-02 DIAGNOSIS — R928 Other abnormal and inconclusive findings on diagnostic imaging of breast: Secondary | ICD-10-CM | POA: Diagnosis not present

## 2023-05-02 DIAGNOSIS — N6489 Other specified disorders of breast: Secondary | ICD-10-CM | POA: Diagnosis not present

## 2023-05-22 DIAGNOSIS — Z7689 Persons encountering health services in other specified circumstances: Secondary | ICD-10-CM | POA: Diagnosis not present

## 2023-06-19 DIAGNOSIS — L650 Telogen effluvium: Secondary | ICD-10-CM | POA: Diagnosis not present

## 2023-08-08 DIAGNOSIS — E1169 Type 2 diabetes mellitus with other specified complication: Secondary | ICD-10-CM | POA: Diagnosis not present

## 2023-08-08 DIAGNOSIS — I10 Essential (primary) hypertension: Secondary | ICD-10-CM | POA: Diagnosis not present

## 2023-08-08 DIAGNOSIS — E785 Hyperlipidemia, unspecified: Secondary | ICD-10-CM | POA: Diagnosis not present

## 2023-08-08 DIAGNOSIS — E669 Obesity, unspecified: Secondary | ICD-10-CM | POA: Diagnosis not present

## 2023-11-22 DIAGNOSIS — N6315 Unspecified lump in the right breast, overlapping quadrants: Secondary | ICD-10-CM | POA: Diagnosis not present

## 2023-11-22 DIAGNOSIS — R92321 Mammographic fibroglandular density, right breast: Secondary | ICD-10-CM | POA: Diagnosis not present

## 2023-12-27 DIAGNOSIS — N6009 Solitary cyst of unspecified breast: Secondary | ICD-10-CM | POA: Diagnosis not present

## 2024-01-18 DIAGNOSIS — Z23 Encounter for immunization: Secondary | ICD-10-CM | POA: Diagnosis not present

## 2024-02-12 DIAGNOSIS — J45990 Exercise induced bronchospasm: Secondary | ICD-10-CM | POA: Diagnosis not present

## 2024-02-12 DIAGNOSIS — I10 Essential (primary) hypertension: Secondary | ICD-10-CM | POA: Diagnosis not present

## 2024-02-12 DIAGNOSIS — E785 Hyperlipidemia, unspecified: Secondary | ICD-10-CM | POA: Diagnosis not present

## 2024-02-12 DIAGNOSIS — E1169 Type 2 diabetes mellitus with other specified complication: Secondary | ICD-10-CM | POA: Diagnosis not present
